# Patient Record
Sex: Male | Born: 1955 | Race: Black or African American | Hispanic: No | Marital: Single | Smoking: Current every day smoker
Health system: Southern US, Community
[De-identification: ages and names within clinical notes are randomized; demographics above are authoritative.]

## PROBLEM LIST (undated history)

## (undated) DIAGNOSIS — M47812 Spondylosis without myelopathy or radiculopathy, cervical region: Secondary | ICD-10-CM

## (undated) DIAGNOSIS — R7302 Impaired glucose tolerance (oral): Secondary | ICD-10-CM

## (undated) DIAGNOSIS — I10 Essential (primary) hypertension: Secondary | ICD-10-CM

## (undated) DIAGNOSIS — E782 Mixed hyperlipidemia: Secondary | ICD-10-CM

## (undated) DIAGNOSIS — E119 Type 2 diabetes mellitus without complications: Secondary | ICD-10-CM

## (undated) DIAGNOSIS — J309 Allergic rhinitis, unspecified: Secondary | ICD-10-CM

## (undated) DIAGNOSIS — F17200 Nicotine dependence, unspecified, uncomplicated: Secondary | ICD-10-CM

## (undated) DIAGNOSIS — B1910 Unspecified viral hepatitis B without hepatic coma: Secondary | ICD-10-CM

## (undated) DIAGNOSIS — T7840XA Allergy, unspecified, initial encounter: Secondary | ICD-10-CM

## (undated) DIAGNOSIS — Z8719 Personal history of other diseases of the digestive system: Secondary | ICD-10-CM

## (undated) HISTORY — DX: Essential (primary) hypertension: I10

## (undated) HISTORY — DX: Nicotine dependence, unspecified, uncomplicated: F17.200

## (undated) HISTORY — DX: Allergic rhinitis, unspecified: J30.9

## (undated) HISTORY — DX: Allergy, unspecified, initial encounter: T78.40XA

## (undated) HISTORY — PX: WISDOM TOOTH EXTRACTION: SHX21

## (undated) HISTORY — DX: Personal history of other diseases of the digestive system: Z87.19

## (undated) HISTORY — DX: Impaired glucose tolerance (oral): R73.02

## (undated) HISTORY — DX: Mixed hyperlipidemia: E78.2

## (undated) HISTORY — DX: Unspecified viral hepatitis B without hepatic coma: B19.10

## (undated) HISTORY — DX: Type 2 diabetes mellitus without complications: E11.9

## (undated) HISTORY — DX: Spondylosis without myelopathy or radiculopathy, cervical region: M47.812

---

## 2004-03-18 ENCOUNTER — Emergency Department (HOSPITAL_COMMUNITY): Admission: EM | Admit: 2004-03-18 | Discharge: 2004-03-18 | Payer: Self-pay | Admitting: Family Medicine

## 2004-08-20 ENCOUNTER — Ambulatory Visit: Payer: Self-pay | Admitting: Internal Medicine

## 2004-08-26 ENCOUNTER — Ambulatory Visit: Payer: Self-pay | Admitting: Internal Medicine

## 2004-08-27 ENCOUNTER — Ambulatory Visit: Payer: Self-pay

## 2004-09-08 ENCOUNTER — Emergency Department (HOSPITAL_COMMUNITY): Admission: EM | Admit: 2004-09-08 | Discharge: 2004-09-08 | Payer: Self-pay | Admitting: Emergency Medicine

## 2004-09-16 ENCOUNTER — Ambulatory Visit: Payer: Self-pay | Admitting: Internal Medicine

## 2004-09-24 ENCOUNTER — Ambulatory Visit: Payer: Self-pay | Admitting: Critical Care Medicine

## 2005-04-07 ENCOUNTER — Ambulatory Visit: Payer: Self-pay | Admitting: Internal Medicine

## 2005-08-04 ENCOUNTER — Ambulatory Visit: Payer: Self-pay | Admitting: Internal Medicine

## 2005-08-16 ENCOUNTER — Ambulatory Visit: Payer: Self-pay

## 2005-08-30 ENCOUNTER — Emergency Department (HOSPITAL_COMMUNITY): Admission: EM | Admit: 2005-08-30 | Discharge: 2005-08-31 | Payer: Self-pay | Admitting: Emergency Medicine

## 2005-08-30 ENCOUNTER — Ambulatory Visit: Payer: Self-pay | Admitting: Internal Medicine

## 2005-08-31 ENCOUNTER — Ambulatory Visit: Payer: Self-pay | Admitting: Internal Medicine

## 2005-08-31 ENCOUNTER — Ambulatory Visit (HOSPITAL_COMMUNITY): Admission: RE | Admit: 2005-08-31 | Discharge: 2005-08-31 | Payer: Self-pay | Admitting: Internal Medicine

## 2005-09-02 ENCOUNTER — Ambulatory Visit (HOSPITAL_COMMUNITY): Admission: RE | Admit: 2005-09-02 | Discharge: 2005-09-02 | Payer: Self-pay | Admitting: Internal Medicine

## 2005-09-05 ENCOUNTER — Ambulatory Visit: Payer: Self-pay | Admitting: Internal Medicine

## 2005-12-07 ENCOUNTER — Ambulatory Visit: Payer: Self-pay | Admitting: Internal Medicine

## 2006-01-09 ENCOUNTER — Ambulatory Visit: Payer: Self-pay | Admitting: Internal Medicine

## 2006-08-15 ENCOUNTER — Ambulatory Visit: Payer: Self-pay | Admitting: Pulmonary Disease

## 2006-10-11 ENCOUNTER — Ambulatory Visit: Payer: Self-pay | Admitting: Internal Medicine

## 2007-05-29 ENCOUNTER — Telehealth: Payer: Self-pay | Admitting: Internal Medicine

## 2007-06-01 DIAGNOSIS — Z9189 Other specified personal risk factors, not elsewhere classified: Secondary | ICD-10-CM | POA: Insufficient documentation

## 2007-06-01 DIAGNOSIS — R079 Chest pain, unspecified: Secondary | ICD-10-CM | POA: Insufficient documentation

## 2007-06-01 DIAGNOSIS — E785 Hyperlipidemia, unspecified: Secondary | ICD-10-CM | POA: Insufficient documentation

## 2007-06-01 DIAGNOSIS — B1910 Unspecified viral hepatitis B without hepatic coma: Secondary | ICD-10-CM

## 2007-06-01 DIAGNOSIS — M47812 Spondylosis without myelopathy or radiculopathy, cervical region: Secondary | ICD-10-CM | POA: Insufficient documentation

## 2007-06-01 DIAGNOSIS — F17200 Nicotine dependence, unspecified, uncomplicated: Secondary | ICD-10-CM

## 2007-06-01 DIAGNOSIS — Z8719 Personal history of other diseases of the digestive system: Secondary | ICD-10-CM

## 2007-06-01 DIAGNOSIS — R51 Headache: Secondary | ICD-10-CM | POA: Insufficient documentation

## 2007-06-01 DIAGNOSIS — R519 Headache, unspecified: Secondary | ICD-10-CM | POA: Insufficient documentation

## 2007-06-01 HISTORY — DX: Spondylosis without myelopathy or radiculopathy, cervical region: M47.812

## 2007-06-01 HISTORY — DX: Nicotine dependence, unspecified, uncomplicated: F17.200

## 2007-06-01 HISTORY — DX: Personal history of other diseases of the digestive system: Z87.19

## 2007-06-01 HISTORY — DX: Unspecified viral hepatitis B without hepatic coma: B19.10

## 2007-06-04 ENCOUNTER — Ambulatory Visit: Payer: Self-pay | Admitting: Internal Medicine

## 2007-06-04 DIAGNOSIS — I10 Essential (primary) hypertension: Secondary | ICD-10-CM

## 2007-06-04 HISTORY — DX: Essential (primary) hypertension: I10

## 2007-07-02 ENCOUNTER — Ambulatory Visit: Payer: Self-pay | Admitting: Internal Medicine

## 2007-07-02 LAB — CONVERTED CEMR LAB
ALT: 23 units/L (ref 0–53)
AST: 20 units/L (ref 0–37)
Albumin: 3.9 g/dL (ref 3.5–5.2)
BUN: 14 mg/dL (ref 6–23)
Basophils Absolute: 0 10*3/uL (ref 0.0–0.1)
Bilirubin, Direct: 0.2 mg/dL (ref 0.0–0.3)
Cholesterol: 249 mg/dL (ref 0–200)
Direct LDL: 152.8 mg/dL
GFR calc Af Amer: 115 mL/min
GFR calc non Af Amer: 95 mL/min
Hemoglobin: 14.5 g/dL (ref 13.0–17.0)
Hgb A1c MFr Bld: 6.2 % — ABNORMAL HIGH (ref 4.6–6.0)
Lymphocytes Relative: 41 % (ref 12.0–46.0)
MCV: 82.4 fL (ref 78.0–100.0)
Neutro Abs: 3.7 10*3/uL (ref 1.4–7.7)
Neutrophils Relative %: 47.9 % (ref 43.0–77.0)
TSH: 1.61 microintl units/mL (ref 0.35–5.50)
Total CHOL/HDL Ratio: 3.6
VLDL: 25 mg/dL (ref 0–40)
WBC: 7.6 10*3/uL (ref 4.5–10.5)

## 2007-07-03 ENCOUNTER — Ambulatory Visit: Payer: Self-pay | Admitting: Internal Medicine

## 2007-07-03 DIAGNOSIS — E782 Mixed hyperlipidemia: Secondary | ICD-10-CM | POA: Insufficient documentation

## 2007-07-03 HISTORY — DX: Mixed hyperlipidemia: E78.2

## 2007-07-09 ENCOUNTER — Telehealth: Payer: Self-pay | Admitting: Internal Medicine

## 2007-08-06 ENCOUNTER — Encounter: Payer: Self-pay | Admitting: Internal Medicine

## 2007-10-02 ENCOUNTER — Encounter: Payer: Self-pay | Admitting: Internal Medicine

## 2007-10-22 ENCOUNTER — Encounter: Payer: Self-pay | Admitting: Internal Medicine

## 2008-01-07 ENCOUNTER — Encounter: Payer: Self-pay | Admitting: Internal Medicine

## 2008-03-24 ENCOUNTER — Ambulatory Visit: Payer: Self-pay | Admitting: Internal Medicine

## 2008-03-24 DIAGNOSIS — E119 Type 2 diabetes mellitus without complications: Secondary | ICD-10-CM

## 2008-03-24 HISTORY — DX: Type 2 diabetes mellitus without complications: E11.9

## 2008-07-14 ENCOUNTER — Telehealth: Payer: Self-pay | Admitting: Internal Medicine

## 2008-07-21 ENCOUNTER — Ambulatory Visit: Payer: Self-pay | Admitting: Internal Medicine

## 2008-07-21 LAB — CONVERTED CEMR LAB
BUN: 13 mg/dL (ref 6–23)
Basophils Absolute: 0 10*3/uL (ref 0.0–0.1)
Basophils Relative: 0.6 % (ref 0.0–3.0)
Bilirubin Urine: NEGATIVE
Bilirubin, Direct: 0.1 mg/dL (ref 0.0–0.3)
CO2: 30 meq/L (ref 19–32)
Cholesterol: 242 mg/dL (ref 0–200)
Direct LDL: 145.3 mg/dL
GFR calc Af Amer: 114 mL/min
GFR calc non Af Amer: 95 mL/min
HCT: 45.1 % (ref 39.0–52.0)
Hemoglobin: 15.1 g/dL (ref 13.0–17.0)
Ketones, ur: NEGATIVE mg/dL
Lymphocytes Relative: 36.3 % (ref 12.0–46.0)
MCHC: 33.4 g/dL (ref 30.0–36.0)
MCV: 83.2 fL (ref 78.0–100.0)
Specific Gravity, Urine: 1.025 (ref 1.000–1.03)
Total Bilirubin: 0.9 mg/dL (ref 0.3–1.2)
Total Protein, Urine: NEGATIVE mg/dL
Total Protein: 7.4 g/dL (ref 6.0–8.3)
Urobilinogen, UA: 0.2 (ref 0.0–1.0)
VLDL: 32 mg/dL (ref 0–40)
pH: 5.5 (ref 5.0–8.0)

## 2008-07-24 ENCOUNTER — Ambulatory Visit: Payer: Self-pay | Admitting: Internal Medicine

## 2008-11-13 ENCOUNTER — Ambulatory Visit: Payer: Self-pay | Admitting: Internal Medicine

## 2008-11-13 DIAGNOSIS — J209 Acute bronchitis, unspecified: Secondary | ICD-10-CM | POA: Insufficient documentation

## 2009-01-21 ENCOUNTER — Telehealth (INDEPENDENT_AMBULATORY_CARE_PROVIDER_SITE_OTHER): Payer: Self-pay | Admitting: *Deleted

## 2009-05-05 ENCOUNTER — Ambulatory Visit: Payer: Self-pay | Admitting: Internal Medicine

## 2009-05-05 DIAGNOSIS — J309 Allergic rhinitis, unspecified: Secondary | ICD-10-CM | POA: Insufficient documentation

## 2009-05-05 DIAGNOSIS — H109 Unspecified conjunctivitis: Secondary | ICD-10-CM | POA: Insufficient documentation

## 2009-05-05 HISTORY — DX: Allergic rhinitis, unspecified: J30.9

## 2009-07-07 ENCOUNTER — Encounter (INDEPENDENT_AMBULATORY_CARE_PROVIDER_SITE_OTHER): Payer: Self-pay | Admitting: *Deleted

## 2010-07-29 NOTE — Letter (Signed)
Summary: Referral - not able to see patient  [facility]  [address] [place], [place] [address]   Phone: [phone]  Fax: [phone]    July 07, 2009   Re:   Peter Schmidt DOB:  1956/06/16 MRN:   [id number]    Dear Dr. Oliver Barre:  Thank you for your kind referral of the above patient.  We have attempted to schedule the recommended procedure for a Colonoscopy but have not been able to schedule because:   X  The patient was not available by phone and/or has not returned our calls.  ___ The patient declined to schedule the procedure at this time.  We appreciate the referral and hope that we will have the opportunity to treat this patient in the future.    Sincerely,    [facility] [phone]

## 2010-09-23 ENCOUNTER — Other Ambulatory Visit: Payer: Self-pay | Admitting: Internal Medicine

## 2010-09-27 ENCOUNTER — Telehealth: Payer: Self-pay

## 2010-09-27 MED ORDER — LISINOPRIL 5 MG PO TABS: 5.0000 mg | ORAL_TABLET | Freq: Every day | ORAL | Status: DC

## 2010-09-27 MED ORDER — SIMVASTATIN 40 MG PO TABS: 40.0000 mg | ORAL_TABLET | Freq: Every day | ORAL | Status: DC

## 2010-09-27 MED ORDER — METFORMIN HCL ER 500 MG PO TB24: 500.0000 mg | ORAL_TABLET | Freq: Every day | ORAL | Status: DC

## 2010-09-27 NOTE — Telephone Encounter (Signed)
Pt called requesting refills of Glucophage, Lisinopril and Simvastatin to [facility]. CPX scheduled 05/03

## 2010-10-24 ENCOUNTER — Encounter: Payer: Self-pay | Admitting: Internal Medicine

## 2010-10-24 DIAGNOSIS — Z0001 Encounter for general adult medical examination with abnormal findings: Secondary | ICD-10-CM | POA: Insufficient documentation

## 2010-10-24 DIAGNOSIS — Z0000 Encounter for general adult medical examination without abnormal findings: Secondary | ICD-10-CM

## 2010-10-25 ENCOUNTER — Other Ambulatory Visit: Payer: Self-pay

## 2010-10-25 ENCOUNTER — Other Ambulatory Visit: Payer: Self-pay | Admitting: Internal Medicine

## 2010-10-25 DIAGNOSIS — Z0389 Encounter for observation for other suspected diseases and conditions ruled out: Secondary | ICD-10-CM

## 2010-10-25 DIAGNOSIS — Z0000 Encounter for general adult medical examination without abnormal findings: Secondary | ICD-10-CM

## 2010-10-28 ENCOUNTER — Ambulatory Visit: Payer: Self-pay | Admitting: Internal Medicine

## 2010-10-28 ENCOUNTER — Encounter: Payer: Self-pay | Admitting: Internal Medicine

## 2010-10-28 ENCOUNTER — Other Ambulatory Visit (INDEPENDENT_AMBULATORY_CARE_PROVIDER_SITE_OTHER): Payer: Self-pay

## 2010-10-28 ENCOUNTER — Ambulatory Visit (INDEPENDENT_AMBULATORY_CARE_PROVIDER_SITE_OTHER): Payer: Self-pay | Admitting: Internal Medicine

## 2010-10-28 VITALS — BP 122/64 | HR 83 | Temp 99.1°F | Ht 68.0 in | Wt 257.8 lb

## 2010-10-28 DIAGNOSIS — E119 Type 2 diabetes mellitus without complications: Secondary | ICD-10-CM

## 2010-10-28 DIAGNOSIS — Z0000 Encounter for general adult medical examination without abnormal findings: Secondary | ICD-10-CM

## 2010-10-28 LAB — HEPATIC FUNCTION PANEL
ALT: 23 U/L (ref 0–53)
AST: 21 U/L (ref 0–37)
Alkaline Phosphatase: 56 U/L (ref 39–117)
Bilirubin, Direct: 0 mg/dL (ref 0.0–0.3)
Total Bilirubin: 0.4 mg/dL (ref 0.3–1.2)

## 2010-10-28 LAB — BASIC METABOLIC PANEL
BUN: 10 mg/dL (ref 6–23)
Calcium: 9.6 mg/dL (ref 8.4–10.5)
GFR: 113.3 mL/min (ref >60.00)
Glucose, Bld: 105 mg/dL — ABNORMAL HIGH (ref 70–99)

## 2010-10-28 LAB — URINALYSIS, ROUTINE W REFLEX MICROSCOPIC
Bilirubin Urine: NEGATIVE
Hgb urine dipstick: NEGATIVE
Nitrite: NEGATIVE
Total Protein, Urine: NEGATIVE

## 2010-10-28 LAB — CBC WITH DIFFERENTIAL/PLATELET
Basophils Absolute: 0 10*3/uL (ref 0.0–0.1)
Basophils Relative: 0.3 % (ref 0.0–3.0)
Eosinophils Absolute: 0.1 10*3/uL (ref 0.0–0.7)
MCHC: 32.6 g/dL (ref 30.0–36.0)
MCV: 82.9 fl (ref 78.0–100.0)
Monocytes Absolute: 0.5 10*3/uL (ref 0.1–1.0)
Neutrophils Relative %: 51 % (ref 43.0–77.0)
Platelets: 221 10*3/uL (ref 150.0–400.0)
RDW: 14.4 % (ref 11.5–14.6)

## 2010-10-28 LAB — TSH: TSH: 0.86 u[IU]/mL (ref 0.35–5.50)

## 2010-10-28 LAB — LIPID PANEL: Cholesterol: 186 mg/dL (ref 0–200)

## 2010-10-28 MED ORDER — HYDROCODONE-ACETAMINOPHEN 5-500 MG PO TABS: 1.0000 | ORAL_TABLET | Freq: Four times a day (QID) | ORAL | Status: DC | PRN

## 2010-10-28 MED ORDER — LISINOPRIL 5 MG PO TABS: 5.0000 mg | ORAL_TABLET | Freq: Every day | ORAL | Status: DC

## 2010-10-28 MED ORDER — VARDENAFIL HCL 20 MG PO TABS: 20.0000 mg | ORAL_TABLET | Freq: Every day | ORAL | Status: DC | PRN

## 2010-10-28 MED ORDER — METFORMIN HCL ER 500 MG PO TB24: 500.0000 mg | ORAL_TABLET | Freq: Every day | ORAL | Status: DC

## 2010-10-28 MED ORDER — SIMVASTATIN 40 MG PO TABS: 40.0000 mg | ORAL_TABLET | Freq: Every day | ORAL | Status: DC

## 2010-10-28 NOTE — Assessment & Plan Note (Signed)
With large recent wt gain, control uncertain, Continue all other medications as before, but will need a1c   Lab Results  Component Value Date   HGBA1C 6.2* 07/02/2007

## 2010-10-28 NOTE — Patient Instructions (Signed)
Continue all other medications as before Please go to LAB in the Basement for the blood and/or urine tests to be done today Please call the number on the Blue Card (the [employer] System) for results of testing in 2-3 days Please return in 1 year for your yearly visit, or sooner if needed, with Lab testing done 3-5 days before  

## 2010-10-28 NOTE — Progress Notes (Signed)
Subjective:    Patient ID: Peter Schmidt, male    DOB: 1955/07/11, 55 y.o.   MRN: [id number]  HPI Here for wellness and f/u;  Overall doing ok;  Pt denies CP, worsening SOB, DOE, wheezing, orthopnea, PND, worsening LE edema, palpitations, dizziness or syncope.  Pt denies neurological change such as new Headache, facial or extremity weakness.  Pt denies polydipsia, polyuria, or low sugar symptoms. Pt states overall good compliance with treatment and medications, good tolerability, but unfortunately has not been trying to follow lower cholesterol diet.  Pt denies worsening depressive symptoms, suicidal ideation or panic. No fever, wt loss, night sweats, loss of appetite, or other constitutional symptoms.  Pt states good ability with ADL's, low fall risk, home safety reviewed and adequate, no significant changes in hearing or vision, and occasionally active with exercise. Wt is actually up 27 lbs since nov 2010 when last seen. Denies daytime hypersomnia Past Medical History  Diagnosis Date  . HEPATITIS B 06/01/2007  . DIABETES MELLITUS, TYPE II 03/24/2008  . HYPERLIPIDEMIA 07/03/2007  . TOBACCO ABUSE 06/01/2007  . HYPERTENSION 06/04/2007  . ALLERGIC RHINITIS 05/05/2009  . SPONDYLOSIS, CERVICAL 06/01/2007  . RECTAL BLEEDING, HX OF 06/01/2007  . DRUG ABUSE, HX OF 06/01/2007   No past surgical history on file.  reports that he has been smoking.  He does not have any smokeless tobacco history on file. He reports that he does not drink alcohol or use illicit drugs. family history includes Alcohol abuse in his father; Cancer in his father; Diabetes in his mother; Heart disease in his mother; and Stroke in his father and mother. No Known Allergies Current Outpatient Prescriptions on File Prior to Visit  Medication Sig Dispense Refill  . DISCONTD: HYDROcodone-acetaminophen (VICODIN) 5-500 MG per tablet One tablet by mouth every 12 hours as needed for neck pain       . DISCONTD: lisinopril (PRINIVIL,ZESTRIL) 5 MG  tablet Take 1 tablet (5 mg total) by mouth daily.  30 tablet  1  . DISCONTD: metFORMIN (GLUCOPHAGE-XR) 500 MG 24 hr tablet Take 1 tablet (500 mg total) by mouth daily with breakfast.  30 tablet  1  . DISCONTD: simvastatin (ZOCOR) 40 MG tablet Take 1 tablet (40 mg total) by mouth at bedtime.  30 tablet  1  . aspirin 81 MG EC tablet Take 81 mg by mouth daily.        Marland Kitchen DISCONTD: cetirizine (ZYRTEC) 10 MG tablet Take 10 mg by mouth daily.        Marland Kitchen DISCONTD: erythromycin ophthalmic ointment Use as directed 4 times a day for 10 days       . DISCONTD: vardenafil (LEVITRA) 20 MG tablet 1 by mouth every other day as needed        Review of Systems Review of Systems  Constitutional: Negative for diaphoresis, activity change, appetite change and unexpected weight change.  HENT: Negative for hearing loss, ear pain, facial swelling, mouth sores and neck stiffness.   Eyes: Negative for pain, redness and visual disturbance.  Respiratory: Negative for shortness of breath and wheezing.   Cardiovascular: Negative for chest pain and palpitations.  Gastrointestinal: Negative for diarrhea, blood in stool, abdominal distention and rectal pain.  Genitourinary: Negative for hematuria, flank pain and decreased urine volume.  Musculoskeletal: Negative for myalgias and joint swelling.  Skin: Negative for color change and wound.  Neurological: Negative for syncope and numbness.  Hematological: Negative for adenopathy.  Psychiatric/Behavioral: Negative for hallucinations, self-injury, decreased concentration and agitation.  Objective:   Physical Exam BP 122/64  Pulse 83  Temp(Src) 99.1 F (37.3 C) (Oral)  Ht 5\' 8"  (1.727 m)  Wt 257 lb 12 oz (116.915 kg)  BMI 39.19 kg/m2  SpO2 95% Physical Exam  VS noted/morbid obese Constitutional: Pt is oriented to person, place, and time. Appears well-developed and well-nourished.  HENT:  Head: Normocephalic and atraumatic.  Right Ear: External ear normal.  Left Ear:  External ear normal.  Nose: Nose normal.  Mouth/Throat: Oropharynx is clear and moist.  Eyes: Conjunctivae and EOM are normal. Pupils are equal, round, and reactive to light.  Neck: Normal range of motion. Neck supple. No JVD present. No tracheal deviation present.  Cardiovascular: Normal rate, regular rhythm, normal heart sounds and intact distal pulses.   Pulmonary/Chest: Effort normal and breath sounds normal.  Abdominal: Soft. Bowel sounds are normal. There is no tenderness.  Musculoskeletal: Normal range of motion. Exhibits no edema.  Lymphadenopathy:  Has no cervical adenopathy.  Neurological: Pt is alert and oriented to person, place, and time. Pt has normal reflexes. No cranial nerve deficit.  Skin: Skin is warm and dry. No rash noted.  Psychiatric:  Has  normal mood and affect. Behavior is normal. 1+ nervous        Assessment & Plan:

## 2010-10-28 NOTE — Assessment & Plan Note (Signed)

## 2010-11-12 NOTE — Assessment & Plan Note (Signed)
[facility]                             PULMONARY OFFICE NOTE   NAME:Peter Schmidt, Peter Schmidt                          MRN:          [id number]  DATE:08/15/2006                            DOB:          [birth year]    HISTORY OF PRESENT ILLNESS:  The patient is a 55 year old African  American male patient of Dr. Olegario Messier who has a known history of chronic  neck pain, alcoholism and hyperlipidemia presents for an acute office  visit.  The patient complains of a 1 day history of fever, T-max of 103,  body aches and cold symptoms with cough, nasal congestion.  The patient  is not using over the counter products for treatment.  Denies any chest  pain, orthopnea, Hemostasis, recent travel, antibiotic use, nausea,  vomiting.   PAST MEDICAL HISTORY:  Reviewed.   MEDICATIONS:  Reviewed.   PHYSICAL EXAMINATION:  The patient is a pleasant obese male in no acute  distress.  His temperature is 103.1, blood pressure 118/78, O2 saturations 96% on  room air, weight is 2049.  HEENT:  His nasal mucosa is red and swollen.  No tender sinuses. TMs  normal.  Posterior pharynx is clear.  NECK:  Supple without adenopathy.  LUNGS:  Clear to auscultation bilaterally without any wheezing or  crackles.  CARDIAC:  Regular rate and rhythm without murmur, rub or gallop.  ABDOMEN:  Obese, soft and nontender.  No hepatosplenomegaly.  EXTREMITIES:  Warm without any calf tenderness, cyanosis, clubbing or  edema.  SKIN:  Warm without rash.   IMPRESSION/PLAN:  Suspected acute influenza.  The patient is to begin  Tamiflu 75 mg b.i.d. times 5 days.  Mucinex DM twice a day for cough.  Tylenol for fever.  Increase fluid intake.  I had advised the patient  his symptoms should run its course over the next 5-7 days.  If symptoms  worsen or do not improve he is to contact our office.      Rubye Oaks, NP  Electronically Signed      Lonzo Cloud. Kriste Basque, MD  Electronically Signed   TP/MedQ  DD:  08/15/2006  DT: 08/15/2006  Job #: [id number]

## 2011-01-07 ENCOUNTER — Encounter: Payer: Self-pay | Admitting: Internal Medicine

## 2011-01-07 DIAGNOSIS — Z0289 Encounter for other administrative examinations: Secondary | ICD-10-CM

## 2011-11-02 ENCOUNTER — Other Ambulatory Visit: Payer: Self-pay

## 2011-11-02 MED ORDER — METFORMIN HCL ER 500 MG PO TB24: 500.0000 mg | ORAL_TABLET | Freq: Every day | ORAL | Status: DC

## 2011-11-02 MED ORDER — SIMVASTATIN 40 MG PO TABS: 40.0000 mg | ORAL_TABLET | Freq: Every day | ORAL | Status: DC

## 2011-11-17 ENCOUNTER — Other Ambulatory Visit: Payer: Self-pay | Admitting: Internal Medicine

## 2012-01-27 ENCOUNTER — Other Ambulatory Visit: Payer: Self-pay | Admitting: Internal Medicine

## 2012-02-01 ENCOUNTER — Other Ambulatory Visit: Payer: Self-pay

## 2012-02-01 MED ORDER — HYDROCODONE-ACETAMINOPHEN 5-500 MG PO TABS: 1.0000 | ORAL_TABLET | Freq: Four times a day (QID) | ORAL | Status: DC | PRN

## 2012-02-01 MED ORDER — LISINOPRIL 5 MG PO TABS: 5.0000 mg | ORAL_TABLET | Freq: Every day | ORAL | Status: DC

## 2012-02-01 NOTE — Telephone Encounter (Signed)
Patient called to schedule physcial appt. For September and needs refill on BP and pain medication.

## 2012-02-01 NOTE — Telephone Encounter (Signed)
vicodin Done hardcopy to [facility], bp med refilled

## 2012-02-02 NOTE — Telephone Encounter (Signed)
Faxed hardcopy to pharmacy. 

## 2012-02-08 ENCOUNTER — Other Ambulatory Visit: Payer: Self-pay | Admitting: Internal Medicine

## 2012-03-26 ENCOUNTER — Ambulatory Visit (INDEPENDENT_AMBULATORY_CARE_PROVIDER_SITE_OTHER): Payer: PRIVATE HEALTH INSURANCE | Admitting: Internal Medicine

## 2012-03-26 ENCOUNTER — Other Ambulatory Visit (INDEPENDENT_AMBULATORY_CARE_PROVIDER_SITE_OTHER): Payer: PRIVATE HEALTH INSURANCE

## 2012-03-26 ENCOUNTER — Encounter: Payer: Self-pay | Admitting: Internal Medicine

## 2012-03-26 VITALS — BP 110/62 | HR 78 | Temp 98.1°F | Ht 68.0 in | Wt 248.5 lb

## 2012-03-26 DIAGNOSIS — Z0000 Encounter for general adult medical examination without abnormal findings: Secondary | ICD-10-CM

## 2012-03-26 DIAGNOSIS — E119 Type 2 diabetes mellitus without complications: Secondary | ICD-10-CM

## 2012-03-26 DIAGNOSIS — Z23 Encounter for immunization: Secondary | ICD-10-CM

## 2012-03-26 LAB — BASIC METABOLIC PANEL
BUN: 12 mg/dL (ref 6–23)
Calcium: 9.3 mg/dL (ref 8.4–10.5)
Creatinine, Ser: 0.9 mg/dL (ref 0.4–1.5)
GFR: 114.17 mL/min (ref >60.00)

## 2012-03-26 LAB — HEMOGLOBIN A1C: Hgb A1c MFr Bld: 6.2 % (ref 4.6–6.5)

## 2012-03-26 LAB — CBC WITH DIFFERENTIAL/PLATELET
Basophils Relative: 0.6 % (ref 0.0–3.0)
Eosinophils Relative: 1.1 % (ref 0.0–5.0)
HCT: 41.8 % (ref 39.0–52.0)
Hemoglobin: 13.6 g/dL (ref 13.0–17.0)
Lymphs Abs: 3 10*3/uL (ref 0.7–4.0)
MCV: 80.9 fl (ref 78.0–100.0)
Monocytes Absolute: 0.6 10*3/uL (ref 0.1–1.0)
Neutro Abs: 4.4 10*3/uL (ref 1.4–7.7)
Neutrophils Relative %: 53.4 % (ref 43.0–77.0)
RBC: 5.17 Mil/uL (ref 4.22–5.81)
WBC: 8.1 10*3/uL (ref 4.5–10.5)

## 2012-03-26 LAB — LIPID PANEL
HDL: 62.5 mg/dL (ref >39.00)
Total CHOL/HDL Ratio: 3
Triglycerides: 148 mg/dL (ref 0.0–149.0)

## 2012-03-26 LAB — HEPATIC FUNCTION PANEL: Total Bilirubin: 0.4 mg/dL (ref 0.3–1.2)

## 2012-03-26 MED ORDER — SILDENAFIL CITRATE 100 MG PO TABS
100.0000 mg | ORAL_TABLET | Freq: Every day | ORAL | Status: DC | PRN
Start: 2012-03-26 — End: 2017-09-28

## 2012-03-26 MED ORDER — SIMVASTATIN 40 MG PO TABS: 40.0000 mg | ORAL_TABLET | Freq: Every day | ORAL | Status: DC

## 2012-03-26 MED ORDER — METFORMIN HCL ER 500 MG PO TB24: 500.0000 mg | ORAL_TABLET | Freq: Every day | ORAL | Status: DC

## 2012-03-26 MED ORDER — LISINOPRIL 5 MG PO TABS: 5.0000 mg | ORAL_TABLET | Freq: Every day | ORAL | Status: DC

## 2012-03-26 NOTE — Patient Instructions (Addendum)
Please consider the echocardiogram for the abnormal EKG with Left atrial abnormality Please go to LAB in the Basement for the blood and/or urine tests to be done today You will be contacted by phone if any changes need to be made immediately.  Otherwise, you will receive a letter about your results with an explanation. Please remember to sign up for My Chart at your earliest convenience, as this will be important to you in the future with finding out test results. You had the flu shot today You are otherwise up to date with prevention Sorry we did not have the samples you requested, but you have the viagra coupon to use You will be contacted regarding the referral for: colonoscopy for after Jun 27 2012 Please return in 6 mo with Lab testing done 3-5 days before

## 2012-03-26 NOTE — Progress Notes (Signed)
Subjective:    Patient ID: Peter Schmidt, male    DOB: October 02, 1955, 56 y.o.   MRN: [id number]  HPI  Here for wellness and f/u;  Overall doing ok;  Pt denies CP, worsening SOB, DOE, wheezing, orthopnea, PND, worsening LE edema, palpitations, dizziness or syncope.  Pt denies neurological change such as new Headache, facial or extremity weakness.  Pt denies polydipsia, polyuria, or low sugar symptoms. Pt states overall good compliance with treatment and medications, good tolerability, and trying to follow lower cholesterol diet.  Pt denies worsening depressive symptoms, suicidal ideation or panic. No fever, wt loss, night sweats, loss of appetite, or other constitutional symptoms.  Pt states good ability with ADL's, low fall risk, home safety reviewed and adequate, no significant changes in hearing or vision, and occasionally active with exercise.  No acute complaints Past Medical History  Diagnosis Date  . HEPATITIS B 06/01/2007  . DIABETES MELLITUS, TYPE II 03/24/2008  . HYPERLIPIDEMIA 07/03/2007  . TOBACCO ABUSE 06/01/2007  . HYPERTENSION 06/04/2007  . ALLERGIC RHINITIS 05/05/2009  . SPONDYLOSIS, CERVICAL 06/01/2007  . RECTAL BLEEDING, HX OF 06/01/2007  . DRUG ABUSE, HX OF 06/01/2007   No past surgical history on file.  reports that he has been smoking.  He does not have any smokeless tobacco history on file. He reports that he does not drink alcohol or use illicit drugs. family history includes Alcohol abuse in his father; Cancer in his father; Diabetes in his mother; Heart disease in his mother; and Stroke in his father and mother. No Known Allergies Current Outpatient Prescriptions on File Prior to Visit  Medication Sig Dispense Refill  . aspirin 81 MG EC tablet Take 81 mg by mouth daily.        Marland Kitchen HYDROcodone-acetaminophen (VICODIN) 5-500 MG per tablet Take 1 tablet by mouth every 6 (six) hours as needed for pain. One tablet by mouth every 12 hours as needed for neck pain  60 tablet  2  . vardenafil  (LEVITRA) 20 MG tablet Take 1 tablet (20 mg total) by mouth daily as needed for erectile dysfunction. 1 by mouth every other day as needed  10 tablet  11  . DISCONTD: lisinopril (PRINIVIL,ZESTRIL) 5 MG tablet Take 1 tablet (5 mg total) by mouth daily.  90 tablet  0  . DISCONTD: metFORMIN (GLUCOPHAGE-XR) 500 MG 24 hr tablet take 1 tablet by mouth once daily WITH BREAKFAST  90 tablet  0  . DISCONTD: simvastatin (ZOCOR) 40 MG tablet take 1 tablet by mouth at bedtime  90 tablet  0  . sildenafil (VIAGRA) 100 MG tablet Take 1 tablet (100 mg total) by mouth daily as needed for erectile dysfunction.  3 tablet  0   Review of Systems Review of Systems  Constitutional: Negative for diaphoresis, activity change, appetite change and unexpected weight change.  HENT: Negative for hearing loss, ear pain, facial swelling, mouth sores and neck stiffness.   Eyes: Negative for pain, redness and visual disturbance.  Respiratory: Negative for shortness of breath and wheezing.   Cardiovascular: Negative for chest pain and palpitations.  Gastrointestinal: Negative for diarrhea, blood in stool, abdominal distention and rectal pain.  Genitourinary: Negative for hematuria, flank pain and decreased urine volume.  Musculoskeletal: Negative for myalgias and joint swelling.  Skin: Negative for color change and wound.  Neurological: Negative for syncope and numbness.  Hematological: Negative for adenopathy.  Psychiatric/Behavioral: Negative for hallucinations, self-injury, decreased concentration and agitation.      Objective:   Physical Exam  BP 110/62  Pulse 78  Temp 98.1 F (36.7 C) (Oral)  Ht 5\' 8"  (1.727 m)  Wt 248 lb 8 oz (112.719 kg)  BMI 37.78 kg/m2  SpO2 97% Physical Exam  VS noted Constitutional: Pt is oriented to person, place, and time. Appears well-developed and well-nourished. Lavella Lemons HENT:  Head: Normocephalic and atraumatic.  Right Ear: External ear normal.  Left Ear: External ear normal.  Nose:  Nose normal.  Mouth/Throat: Oropharynx is clear and moist.  Eyes: Conjunctivae and EOM are normal. Pupils are equal, round, and reactive to light.  Neck: Normal range of motion. Neck supple. No JVD present. No tracheal deviation present.  Cardiovascular: Normal rate, regular rhythm, normal heart sounds and intact distal pulses.   Pulmonary/Chest: Effort normal and breath sounds normal.  Abdominal: Soft. Bowel sounds are normal. There is no tenderness.  Musculoskeletal: Normal range of motion. Exhibits no edema.  Lymphadenopathy:  Has no cervical adenopathy.  Neurological: Pt is alert and oriented to person, place, and time. Pt has normal reflexes. No cranial nerve deficit.  Skin: Skin is warm and dry. No rash noted.  Psychiatric:  Has  normal mood and affect. Behavior is normal.     Assessment & Plan:

## 2012-03-26 NOTE — Assessment & Plan Note (Addendum)
Overall doing well, age appropriate education and counseling updated, referrals for preventative services and immunizations addressed, dietary and smoking counseling addressed, most recent labs and ECG reviewed.  I have personally reviewed and have noted: 1) the patient's medical and social history 2) The pt's use of alcohol, tobacco, and illicit drugs 3) The patient's current medications and supplements 4) Functional ability including ADL's, fall risk, home safety risk, hearing and visual impairment 5) Diet and physical activities 6) Evidence for depression or mood disorder 7) The patient's height, weight, and BMI have been recorded in the chart I have made referrals, and provided counseling and education based on review of the above ECG reviewed as per emr - to consider echo; also for colonscopy as he is due

## 2012-03-27 LAB — PSA: PSA: 0.25 ng/mL (ref 0.10–4.00)

## 2012-03-28 ENCOUNTER — Encounter: Payer: Self-pay | Admitting: Internal Medicine

## 2012-08-19 ENCOUNTER — Other Ambulatory Visit: Payer: Self-pay | Admitting: Internal Medicine

## 2012-08-20 ENCOUNTER — Telehealth: Payer: Self-pay | Admitting: Internal Medicine

## 2012-08-20 MED ORDER — HYDROCODONE-ACETAMINOPHEN 5-325 MG PO TABS: 1.0000 | ORAL_TABLET | Freq: Four times a day (QID) | ORAL | Status: DC | PRN

## 2012-08-20 NOTE — Telephone Encounter (Signed)
They no longer make Vicodin5/500, request call back

## 2012-08-20 NOTE — Telephone Encounter (Signed)
Done hardcopy to [facility]  

## 2012-08-20 NOTE — Telephone Encounter (Signed)
Faxed hardcopy to pharmacy. 

## 2012-08-21 NOTE — Telephone Encounter (Signed)
Faxed hardcopy to pharmacy and called the patient to inform of change and refill.

## 2012-09-24 ENCOUNTER — Ambulatory Visit: Payer: PRIVATE HEALTH INSURANCE | Admitting: Internal Medicine

## 2012-09-24 DIAGNOSIS — Z0289 Encounter for other administrative examinations: Secondary | ICD-10-CM

## 2013-03-27 ENCOUNTER — Other Ambulatory Visit: Payer: Self-pay | Admitting: Internal Medicine

## 2013-05-05 ENCOUNTER — Emergency Department (HOSPITAL_COMMUNITY)
Admission: EM | Admit: 2013-05-05 | Discharge: 2013-05-05 | Disposition: A | Discharge status: Home / Self Care | Payer: PRIVATE HEALTH INSURANCE | Attending: Emergency Medicine | Admitting: Emergency Medicine

## 2013-05-05 ENCOUNTER — Emergency Department (HOSPITAL_COMMUNITY): Payer: PRIVATE HEALTH INSURANCE

## 2013-05-05 ENCOUNTER — Encounter (HOSPITAL_COMMUNITY): Payer: Self-pay | Admitting: Emergency Medicine

## 2013-05-05 DIAGNOSIS — E782 Mixed hyperlipidemia: Secondary | ICD-10-CM | POA: Insufficient documentation

## 2013-05-05 DIAGNOSIS — Z7982 Long term (current) use of aspirin: Secondary | ICD-10-CM | POA: Insufficient documentation

## 2013-05-05 DIAGNOSIS — S0003XA Contusion of scalp, initial encounter: Secondary | ICD-10-CM | POA: Insufficient documentation

## 2013-05-05 DIAGNOSIS — S161XXA Strain of muscle, fascia and tendon at neck level, initial encounter: Secondary | ICD-10-CM

## 2013-05-05 DIAGNOSIS — E119 Type 2 diabetes mellitus without complications: Secondary | ICD-10-CM | POA: Insufficient documentation

## 2013-05-05 DIAGNOSIS — F17200 Nicotine dependence, unspecified, uncomplicated: Secondary | ICD-10-CM | POA: Insufficient documentation

## 2013-05-05 DIAGNOSIS — R209 Unspecified disturbances of skin sensation: Secondary | ICD-10-CM | POA: Insufficient documentation

## 2013-05-05 DIAGNOSIS — M549 Dorsalgia, unspecified: Secondary | ICD-10-CM | POA: Insufficient documentation

## 2013-05-05 DIAGNOSIS — Y92410 Unspecified street and highway as the place of occurrence of the external cause: Secondary | ICD-10-CM | POA: Insufficient documentation

## 2013-05-05 DIAGNOSIS — Y9389 Activity, other specified: Secondary | ICD-10-CM | POA: Insufficient documentation

## 2013-05-05 DIAGNOSIS — M25569 Pain in unspecified knee: Secondary | ICD-10-CM | POA: Insufficient documentation

## 2013-05-05 DIAGNOSIS — M47812 Spondylosis without myelopathy or radiculopathy, cervical region: Secondary | ICD-10-CM | POA: Insufficient documentation

## 2013-05-05 DIAGNOSIS — B1910 Unspecified viral hepatitis B without hepatic coma: Secondary | ICD-10-CM | POA: Insufficient documentation

## 2013-05-05 DIAGNOSIS — Z79899 Other long term (current) drug therapy: Secondary | ICD-10-CM | POA: Insufficient documentation

## 2013-05-05 DIAGNOSIS — I10 Essential (primary) hypertension: Secondary | ICD-10-CM | POA: Insufficient documentation

## 2013-05-05 DIAGNOSIS — S139XXA Sprain of joints and ligaments of unspecified parts of neck, initial encounter: Secondary | ICD-10-CM | POA: Insufficient documentation

## 2013-05-05 DIAGNOSIS — M25579 Pain in unspecified ankle and joints of unspecified foot: Secondary | ICD-10-CM | POA: Insufficient documentation

## 2013-05-05 MED ORDER — HYDROCODONE-ACETAMINOPHEN 5-325 MG PO TABS
1.0000 | ORAL_TABLET | Freq: Once | ORAL | Status: AC
  Administered 2013-05-05: 1 via ORAL
  Filled 2013-05-05: qty 1

## 2013-05-05 MED ORDER — CYCLOBENZAPRINE HCL 10 MG PO TABS: 10.0000 mg | ORAL_TABLET | Freq: Three times a day (TID) | ORAL | Status: DC | PRN

## 2013-05-05 MED ORDER — HYDROCODONE-ACETAMINOPHEN 5-325 MG PO TABS: 1.0000 | ORAL_TABLET | ORAL | Status: DC | PRN

## 2013-05-05 NOTE — ED Provider Notes (Signed)
Medical screening examination/treatment/procedure(s) were performed by non-physician practitioner and as supervising physician I was immediately available for consultation/collaboration.  EKG Interpretation   None         Rolan Bucco, MD 05/05/13 2236
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
## 2013-05-05 NOTE — ED Notes (Signed)
Error in documentation   xray has not been performed

## 2013-05-09 ENCOUNTER — Ambulatory Visit (INDEPENDENT_AMBULATORY_CARE_PROVIDER_SITE_OTHER): Payer: PRIVATE HEALTH INSURANCE | Admitting: Internal Medicine

## 2013-05-09 ENCOUNTER — Encounter: Payer: Self-pay | Admitting: Internal Medicine

## 2013-05-09 VITALS — BP 120/70 | HR 85 | Temp 97.1°F | Ht 68.0 in | Wt 257.5 lb

## 2013-05-09 DIAGNOSIS — IMO0001 Reserved for inherently not codable concepts without codable children: Secondary | ICD-10-CM

## 2013-05-09 DIAGNOSIS — M5412 Radiculopathy, cervical region: Secondary | ICD-10-CM

## 2013-05-09 DIAGNOSIS — E119 Type 2 diabetes mellitus without complications: Secondary | ICD-10-CM

## 2013-05-09 DIAGNOSIS — I10 Essential (primary) hypertension: Secondary | ICD-10-CM

## 2013-05-09 DIAGNOSIS — M542 Cervicalgia: Secondary | ICD-10-CM

## 2013-05-09 DIAGNOSIS — Z23 Encounter for immunization: Secondary | ICD-10-CM

## 2013-05-09 DIAGNOSIS — Z0000 Encounter for general adult medical examination without abnormal findings: Secondary | ICD-10-CM

## 2013-05-09 DIAGNOSIS — F411 Generalized anxiety disorder: Secondary | ICD-10-CM

## 2013-05-09 MED ORDER — TRAMADOL HCL 50 MG PO TABS: 50.0000 mg | ORAL_TABLET | Freq: Four times a day (QID) | ORAL | Status: DC | PRN

## 2013-05-09 MED ORDER — PREDNISONE 10 MG PO TABS: 10.0000 mg | ORAL_TABLET | Freq: Every day | ORAL | Status: DC

## 2013-05-09 MED ORDER — ALPRAZOLAM 0.25 MG PO TABS: 0.2500 mg | ORAL_TABLET | Freq: Two times a day (BID) | ORAL | Status: DC | PRN

## 2013-05-09 NOTE — Patient Instructions (Addendum)
Please take all new medication as prescribed  - the prednisone, ultram, and alprazolam Please continue all other medications as before You will be contacted regarding the referral for: MRI for neck, and Dr Channing Mutters Please keep your appointments with your specialists as you have planned - therapist  Please continue all other medications as before, and refills have been done if requested. Please have the pharmacy call with any other refills you may need.  Please remember to sign up for My Chart if you have not done so, as this will be important to you in the future with finding out test results, communicating by private email, and scheduling acute appointments online when needed.  Please return in 3 months, or sooner if needed, with Lab testing done 3-5 days before

## 2013-05-09 NOTE — Progress Notes (Signed)
Pre-visit discussion using our clinic review tool. No additional management support is needed unless otherwise documented below in the visit note.  

## 2013-05-10 ENCOUNTER — Telehealth: Payer: Self-pay | Admitting: Internal Medicine

## 2013-05-10 NOTE — Telephone Encounter (Signed)
Letter completed and called the patient left a detailed message letter is ready for pickup at the front desk.

## 2013-05-10 NOTE — Telephone Encounter (Signed)
Pt request out of work note from 05/06/13-05/10/2013. Pt was in the ER Sunday 05/05/13. Please advise. Pt forgot to ask for last night at the ov.

## 2013-05-10 NOTE — Telephone Encounter (Signed)
Ok for note - to robin to help 

## 2013-05-13 DIAGNOSIS — M542 Cervicalgia: Secondary | ICD-10-CM | POA: Insufficient documentation

## 2013-05-13 DIAGNOSIS — M5412 Radiculopathy, cervical region: Secondary | ICD-10-CM | POA: Insufficient documentation

## 2013-05-13 DIAGNOSIS — F411 Generalized anxiety disorder: Secondary | ICD-10-CM | POA: Insufficient documentation

## 2013-05-13 DIAGNOSIS — G8929 Other chronic pain: Secondary | ICD-10-CM | POA: Insufficient documentation

## 2013-05-13 NOTE — Assessment & Plan Note (Signed)
stable overall by history and exam, recent data reviewed with pt, and pt to continue medical treatment as before,  to f/u any worsening symptoms or concerns Lab Results  Component Value Date   HGBA1C 6.2 03/26/2012   To call for onset polys with prednisone tx or cbg > 200
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
## 2013-05-13 NOTE — Assessment & Plan Note (Signed)
stable overall by history and exam, recent data reviewed with pt, and pt to continue medical treatment as before,  to f/u any worsening symptoms or concerns BP Readings from Last 3 Encounters:  05/09/13 120/70  05/05/13 147/83  03/26/12 110/62
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
## 2013-05-13 NOTE — Assessment & Plan Note (Addendum)
Right neck and arm pain, with neuro change, for predpack, for MRI and referral to Dr Roy/NS  Note:  Total time for pt hx, exam, review of record with pt in the room, determination of diagnoses and plan for further eval and tx is > 40 min, with over 50% spent in coordination and counseling of patient

## 2013-05-13 NOTE — Assessment & Plan Note (Signed)
Pt plans to f/u with a therapist, for xanax prn

## 2013-05-17 ENCOUNTER — Telehealth: Payer: Self-pay | Admitting: Internal Medicine

## 2013-05-17 ENCOUNTER — Encounter: Payer: Self-pay | Admitting: Internal Medicine

## 2013-05-17 ENCOUNTER — Ambulatory Visit
Admission: RE | Admit: 2013-05-17 | Discharge: 2013-05-17 | Disposition: A | Discharge status: Home / Self Care | Payer: PRIVATE HEALTH INSURANCE | Source: Ambulatory Visit | Attending: Internal Medicine | Admitting: Internal Medicine

## 2013-05-17 DIAGNOSIS — M5412 Radiculopathy, cervical region: Secondary | ICD-10-CM

## 2013-05-17 NOTE — Telephone Encounter (Signed)
Very sorry, but a prescription for tramadol was just given at last visit, and we should tx with both

## 2013-05-17 NOTE — Telephone Encounter (Signed)
Pt request written Rx for hydrocodone. Pt stated that drug store told him to req from PCP. Please call pt

## 2013-05-17 NOTE — Telephone Encounter (Signed)
Patient informed of MD instructions. 

## 2013-05-17 NOTE — Telephone Encounter (Signed)
Still, since this is a controlled substance, I cannot change at this time

## 2013-05-17 NOTE — Telephone Encounter (Signed)
The patient stated he never filled the tramadol

## 2013-06-24 ENCOUNTER — Other Ambulatory Visit: Payer: Self-pay | Admitting: Internal Medicine

## 2013-09-10 ENCOUNTER — Ambulatory Visit (INDEPENDENT_AMBULATORY_CARE_PROVIDER_SITE_OTHER): Payer: PRIVATE HEALTH INSURANCE | Admitting: Internal Medicine

## 2013-09-10 VITALS — BP 122/70 | HR 76 | Temp 98.5°F | Ht 68.0 in | Wt 251.0 lb

## 2013-09-10 DIAGNOSIS — E119 Type 2 diabetes mellitus without complications: Secondary | ICD-10-CM

## 2013-09-10 DIAGNOSIS — J209 Acute bronchitis, unspecified: Secondary | ICD-10-CM

## 2013-09-10 DIAGNOSIS — I10 Essential (primary) hypertension: Secondary | ICD-10-CM

## 2013-09-10 MED ORDER — AZITHROMYCIN 250 MG PO TABS: ORAL_TABLET | ORAL | Status: DC

## 2013-09-10 MED ORDER — HYDROCODONE-HOMATROPINE 5-1.5 MG/5ML PO SYRP: 5.0000 mL | ORAL_SOLUTION | Freq: Four times a day (QID) | ORAL | Status: DC | PRN

## 2013-09-10 MED ORDER — OSELTAMIVIR PHOSPHATE 75 MG PO CAPS: 75.0000 mg | ORAL_CAPSULE | Freq: Two times a day (BID) | ORAL | Status: DC

## 2013-09-10 NOTE — Progress Notes (Signed)
Pre visit review using our clinic review tool, if applicable. No additional management support is needed unless otherwise documented below in the visit note. 

## 2013-09-10 NOTE — Progress Notes (Signed)
Subjective:    Patient ID: Peter Schmidt, male    DOB: 03-13-1956, 58 y.o.   MRN: [id number]  HPI  Here with acute onset mild to mod 2-3 days ST, HA, general weakness and malaise, with prod cough greenish sputum, but Pt denies chest pain, increased sob or doe, wheezing, orthopnea, PND, increased LE swelling, palpitations, dizziness or syncope, also with nausea and several watery loose stools as well.   Pt denies polydipsia, polyuria.  Past Medical History  Diagnosis Date  . HEPATITIS B 06/01/2007  . DIABETES MELLITUS, TYPE II 03/24/2008  . HYPERLIPIDEMIA 07/03/2007  . TOBACCO ABUSE 06/01/2007  . HYPERTENSION 06/04/2007  . ALLERGIC RHINITIS 05/05/2009  . SPONDYLOSIS, CERVICAL 06/01/2007  . RECTAL BLEEDING, HX OF 06/01/2007   No past surgical history on file.  reports that he has been smoking.  He does not have any smokeless tobacco history on file. He reports that he does not drink alcohol or use illicit drugs. family history includes Alcohol abuse in his father; Cancer in his father; Diabetes in his mother; Heart disease in his mother; Stroke in his father and mother. No Known Allergies Current Outpatient Prescriptions on File Prior to Visit  Medication Sig Dispense Refill  . ALPRAZolam (XANAX) 0.25 MG tablet Take 1 tablet (0.25 mg total) by mouth 2 (two) times daily as needed for anxiety.  40 tablet  0  . cyclobenzaprine (FLEXERIL) 10 MG tablet Take 1 tablet (10 mg total) by mouth 3 (three) times daily as needed for muscle spasms.  30 tablet  0  . HYDROcodone-acetaminophen (NORCO/VICODIN) 5-325 MG per tablet Take 1 tablet by mouth every 4 (four) hours as needed for moderate pain.  15 tablet  0  . lisinopril (PRINIVIL,ZESTRIL) 5 MG tablet Take 5 mg by mouth every morning.      Marland Kitchen lisinopril (PRINIVIL,ZESTRIL) 5 MG tablet take 1 tablet by mouth once daily  90 tablet  3  . metFORMIN (GLUCOPHAGE-XR) 500 MG 24 hr tablet Take 500 mg by mouth daily with breakfast.      . metFORMIN (GLUCOPHAGE-XR) 500 MG  24 hr tablet take 1 tablet by mouth once daily with food  90 tablet  3  . predniSONE (DELTASONE) 10 MG tablet Take 1 tablet (10 mg total) by mouth daily. 3 tabs by mouth per day for 3 days, then 2 tabs per day for 3 days, then 1 tab per day for 3 days, then stop  18 tablet  0  . sildenafil (VIAGRA) 100 MG tablet Take 1 tablet (100 mg total) by mouth daily as needed for erectile dysfunction.  3 tablet  0  . simvastatin (ZOCOR) 40 MG tablet take 1 tablet by mouth at bedtime  90 tablet  3  . traMADol (ULTRAM) 50 MG tablet Take 1 tablet (50 mg total) by mouth every 6 (six) hours as needed.  60 tablet  1  . vardenafil (LEVITRA) 20 MG tablet Take 1 tablet (20 mg total) by mouth daily as needed for erectile dysfunction. 1 by mouth every other day as needed  10 tablet  11   No current facility-administered medications on file prior to visit.   Review of Systems  Constitutional: Negative for unexpected weight change, or unusual diaphoresis  HENT: Negative for tinnitus.   Eyes: Negative for photophobia and visual disturbance.  Respiratory: Negative for choking and stridor.   Gastrointestinal: Negative for vomiting and blood in stool.  Genitourinary: Negative for hematuria and decreased urine volume.  Musculoskeletal: Negative for acute joint  swelling Skin: Negative for color change and wound.  Neurological: Negative for tremors and numbness other than noted  Psychiatric/Behavioral: Negative for decreased concentration or  hyperactivity.       Objective:   Physical Exam BP 122/70  Pulse 76  Temp(Src) 98.5 F (36.9 C) (Oral)  Ht 5\' 8"  (1.727 m)  Wt 251 lb (113.853 kg)  BMI 38.17 kg/m2  SpO2 97% VS noted, mild ill Constitutional: Pt appears well-developed and well-nourished.  HENT: Head: NCAT.  Right Ear: External ear normal.  Left Ear: External ear normal.  Bilat tm's with mild erythema.  Max sinus areas mild tender.  Pharynx with mild erythema, no exudate Eyes: Conjunctivae and EOM are  normal. Pupils are equal, round, and reactive to light.  Neck: Normal range of motion. Neck supple.  Cardiovascular: Normal rate and regular rhythm.   Pulmonary/Chest: Effort normal and breath sounds normal.  Abd:  Soft, NT, non-distended, + BS Neurological: Pt is alert. Not confused  Skin: Skin is warm. No erythema.  Psychiatric: Pt behavior is normal. Thought content normal.     Assessment & Plan:

## 2013-09-10 NOTE — Assessment & Plan Note (Signed)
Mild to mod, ? Viral - for tamillu course, for antibx course,  to f/u any worsening symptoms or concerns

## 2013-09-10 NOTE — Patient Instructions (Signed)
Please take all new medication as prescribed  Please continue all other medications as before, and refills have been done if requested.  Please have the pharmacy call with any other refills you may need.   

## 2013-09-10 NOTE — Assessment & Plan Note (Signed)
stable overall by history and exam, recent data reviewed with pt, and pt to continue medical treatment as before,  to f/u any worsening symptoms or concerns Lab Results  Component Value Date   HGBA1C 6.2 03/26/2012

## 2013-09-10 NOTE — Assessment & Plan Note (Signed)
stable overall by history and exam, recent data reviewed with pt, and pt to continue medical treatment as before,  to f/u any worsening symptoms or concerns' BP Readings from Last 3 Encounters:  09/10/13 122/70  05/09/13 120/70  05/05/13 147/83

## 2013-09-13 ENCOUNTER — Telehealth: Payer: Self-pay

## 2013-09-13 MED ORDER — HYDROCODONE-HOMATROPINE 5-1.5 MG/5ML PO SYRP: 5.0000 mL | ORAL_SOLUTION | Freq: Four times a day (QID) | ORAL | Status: DC | PRN

## 2013-09-13 NOTE — Telephone Encounter (Signed)
Phone call from patient [phone] requesting a refill on Hycodan. Please advise.

## 2013-09-13 NOTE — Telephone Encounter (Signed)
Done hardcopy to [facility]  

## 2013-09-13 NOTE — Telephone Encounter (Signed)
Called the patient informed hardcopy is at the front desk.

## 2013-09-19 ENCOUNTER — Telehealth: Payer: Self-pay

## 2013-09-19 NOTE — Telephone Encounter (Signed)
Relevant patient education assigned to patient using Emmi. ° °

## 2013-12-30 ENCOUNTER — Telehealth: Payer: Self-pay | Admitting: *Deleted

## 2013-12-30 DIAGNOSIS — E119 Type 2 diabetes mellitus without complications: Secondary | ICD-10-CM

## 2013-12-30 NOTE — Telephone Encounter (Signed)
Patient scheduled to come in for follow up DM, Lipid, HTN A1c, bmet, lipid ordered Diabetic bundle

## 2014-01-15 ENCOUNTER — Ambulatory Visit: Payer: PRIVATE HEALTH INSURANCE | Admitting: Internal Medicine

## 2014-01-22 ENCOUNTER — Ambulatory Visit: Payer: PRIVATE HEALTH INSURANCE | Admitting: Internal Medicine

## 2014-07-23 ENCOUNTER — Other Ambulatory Visit: Payer: Self-pay | Admitting: Internal Medicine

## 2014-09-04 ENCOUNTER — Telehealth: Payer: Self-pay | Admitting: Internal Medicine

## 2014-09-04 NOTE — Telephone Encounter (Signed)
Called patient back to see what medication he wants to be placed on since the original message was not clear.

## 2014-09-04 NOTE — Telephone Encounter (Signed)
Patient states tramadol is making him nauseous.  He is requesting to go back on Tramadol.

## 2014-09-05 ENCOUNTER — Telehealth: Payer: Self-pay | Admitting: Internal Medicine

## 2014-09-05 DIAGNOSIS — G894 Chronic pain syndrome: Secondary | ICD-10-CM

## 2014-09-05 NOTE — Telephone Encounter (Signed)
Very sorry, I no longer prescribed hydrocodone for long term pain control  I can refer pt to pain management if he wants to do this. thanks

## 2014-09-05 NOTE — Telephone Encounter (Signed)
Pt called in said that the Tramadol was making him sick and he wanted to go back on the hydrocodone.

## 2014-09-05 NOTE — Telephone Encounter (Signed)
Called patient at home but received no answer and could not leave a message. Will attempt to call later.

## 2014-09-08 NOTE — Telephone Encounter (Signed)
Pt called back in.  Please contact him on his cell number

## 2014-09-09 ENCOUNTER — Telehealth: Payer: Self-pay | Admitting: Internal Medicine

## 2014-09-09 NOTE — Telephone Encounter (Signed)
Pt called in said that he does not want to be referred to a pain clinc.  He said that he uses hydrocodone as needed.  He stated that a a month worth would las him a while.  He said that he only takes it as needed .  I told him I would

## 2014-09-10 NOTE — Telephone Encounter (Signed)
Ok for referral - done 

## 2014-09-10 NOTE — Telephone Encounter (Signed)
Patient would like a referral to pain management since he can not get the hydrocodone from here.

## 2014-10-22 ENCOUNTER — Other Ambulatory Visit: Payer: Self-pay | Admitting: Internal Medicine

## 2014-10-27 ENCOUNTER — Telehealth: Payer: Self-pay | Admitting: Internal Medicine

## 2014-10-27 NOTE — Telephone Encounter (Signed)
Peter Schmidt spoke to patient and advised

## 2014-10-27 NOTE — Telephone Encounter (Signed)
It would appear that his PCP ordered labs for him several times and he never bothered to do any since 2013. No recent labs to allow for renewal of prescriptions. He can see if his PCP will fill tomorrow when he returns. None of these medications is life threatening and can do him harm if left unmonitored.

## 2014-10-27 NOTE — Telephone Encounter (Signed)
metFORMIN (GLUCOPHAGE-XR) 500 MG 24 hr tablet [Pharmacy Med Name: METFORMIN HCL ER 500 MG TABLET]  lisinopril (PRINIVIL,ZESTRIL) 5 MG tablet [Pharmacy Med Name: LISINOPRIL 5 MG TABLET]    simvastatin (ZOCOR) 40 MG tablet [Pharmacy Med Name: SIMVASTATIN 40 MG TABLET]   Patient has been denied meds contingent on appointment. States that he was never told that an appt was needed until today when he discovered that his refills had been denied. i have scheduled an upcoming appt, but patient is completley out of meds. Can we fill temporary script?

## 2014-10-28 ENCOUNTER — Telehealth: Payer: Self-pay | Admitting: Internal Medicine

## 2014-10-28 MED ORDER — SIMVASTATIN 40 MG PO TABS: 40.0000 mg | ORAL_TABLET | Freq: Every day | ORAL | Status: DC

## 2014-10-28 MED ORDER — METFORMIN HCL ER 500 MG PO TB24: ORAL_TABLET | ORAL | Status: DC

## 2014-10-28 MED ORDER — LISINOPRIL 5 MG PO TABS: 5.0000 mg | ORAL_TABLET | Freq: Every day | ORAL | Status: DC

## 2014-10-28 NOTE — Telephone Encounter (Signed)
Patient is very upset that his meds were stopped.  He feels like this is poor practice on Dr. Gwynn Burly behalf.  Patient scheduled an appointment yesterday for 5/11.  He is requesting his meds to get sent as soon as possible with a follow up call as soon as they are sent.  Patient states this is an emergency for him b/c he is out of his meds and currently has a headache.  Patient needs metformin, simvastatin, and lisinopril sent as soon as possible.

## 2014-10-28 NOTE — Telephone Encounter (Signed)
Pt was overdue for appt last saw md 08/2013. Sent 30 day until appt 11/05/14 to [facility]...Peter Schmidt

## 2014-11-05 ENCOUNTER — Other Ambulatory Visit: Payer: Self-pay

## 2014-11-05 ENCOUNTER — Ambulatory Visit (INDEPENDENT_AMBULATORY_CARE_PROVIDER_SITE_OTHER): Payer: 59 | Admitting: Internal Medicine

## 2014-11-05 ENCOUNTER — Encounter: Payer: Self-pay | Admitting: Internal Medicine

## 2014-11-05 ENCOUNTER — Other Ambulatory Visit: Payer: 59

## 2014-11-05 VITALS — BP 124/82 | HR 85 | Temp 98.3°F | Resp 18 | Ht 68.0 in | Wt 254.0 lb

## 2014-11-05 DIAGNOSIS — R7302 Impaired glucose tolerance (oral): Secondary | ICD-10-CM

## 2014-11-05 DIAGNOSIS — M47812 Spondylosis without myelopathy or radiculopathy, cervical region: Secondary | ICD-10-CM | POA: Diagnosis not present

## 2014-11-05 DIAGNOSIS — Z0000 Encounter for general adult medical examination without abnormal findings: Secondary | ICD-10-CM

## 2014-11-05 HISTORY — DX: Impaired glucose tolerance (oral): R73.02

## 2014-11-05 MED ORDER — VARDENAFIL HCL 20 MG PO TABS: 20.0000 mg | ORAL_TABLET | Freq: Every day | ORAL | Status: DC | PRN

## 2014-11-05 MED ORDER — SIMVASTATIN 40 MG PO TABS: 40.0000 mg | ORAL_TABLET | Freq: Every day | ORAL | Status: DC

## 2014-11-05 MED ORDER — METFORMIN HCL ER 500 MG PO TB24: 500.0000 mg | ORAL_TABLET | Freq: Every day | ORAL | Status: DC

## 2014-11-05 MED ORDER — HYDROCODONE-ACETAMINOPHEN 5-325 MG PO TABS: ORAL_TABLET | ORAL | Status: DC

## 2014-11-05 MED ORDER — LISINOPRIL 5 MG PO TABS: 5.0000 mg | ORAL_TABLET | Freq: Every day | ORAL | Status: DC

## 2014-11-05 NOTE — Assessment & Plan Note (Signed)
With chronic pain, pt cont's to defer surgury, ok for pain med as requested

## 2014-11-05 NOTE — Assessment & Plan Note (Signed)

## 2014-11-05 NOTE — Telephone Encounter (Signed)
Rx refills done per pt request

## 2014-11-05 NOTE — Progress Notes (Signed)
Pre visit review using our clinic review tool, if applicable. No additional management support is needed unless otherwise documented below in the visit note. 

## 2014-11-05 NOTE — Assessment & Plan Note (Signed)
Also for a1c,  to f/u any worsening symptoms or concerns

## 2014-11-05 NOTE — Progress Notes (Signed)
Subjective:    Patient ID: Peter Schmidt, male    DOB: 1956/04/14, 59 y.o.   MRN: [id number]  HPI  Here for wellness and f/u;  Overall doing ok;  Pt denies Chest pain, worsening SOB, DOE, wheezing, orthopnea, PND, worsening LE edema, palpitations, dizziness or syncope.  Pt denies neurological change such as new headache, facial or extremity weakness.  Pt denies polydipsia, polyuria, or low sugar symptoms. Pt states overall good compliance with treatment and medications, good tolerability, and has been trying to follow appropriate diet.  Pt denies worsening depressive symptoms, suicidal ideation or panic. No fever, night sweats, wt loss, loss of appetite, or other constitutional symptoms.  Pt states good ability with ADL's, has low fall risk, home safety reviewed and adequate, no other significant changes in hearing or vision, and only occasionally active with exercise.  Has chronic neck pain, tramadol casuing gi upset.  Would only take hydrocodone once daily Past Medical History  Diagnosis Date  . HEPATITIS B 06/01/2007  . DIABETES MELLITUS, TYPE II 03/24/2008  . HYPERLIPIDEMIA 07/03/2007  . TOBACCO ABUSE 06/01/2007  . HYPERTENSION 06/04/2007  . ALLERGIC RHINITIS 05/05/2009  . SPONDYLOSIS, CERVICAL 06/01/2007  . RECTAL BLEEDING, HX OF 06/01/2007   No past surgical history on file.  reports that he has been smoking.  He does not have any smokeless tobacco history on file. He reports that he does not drink alcohol or use illicit drugs. family history includes Alcohol abuse in his father; Cancer in his father; Diabetes in his mother; Heart disease in his mother; Stroke in his father and mother. No Known Allergies Current Outpatient Prescriptions on File Prior to Visit  Medication Sig Dispense Refill  . ALPRAZolam (XANAX) 0.25 MG tablet Take 1 tablet (0.25 mg total) by mouth 2 (two) times daily as needed for anxiety. 40 tablet 0  . cyclobenzaprine (FLEXERIL) 10 MG tablet Take 1 tablet (10 mg total) by  mouth 3 (three) times daily as needed for muscle spasms. 30 tablet 0  . HYDROcodone-acetaminophen (NORCO/VICODIN) 5-325 MG per tablet Take 1 tablet by mouth every 4 (four) hours as needed for moderate pain. 15 tablet 0  . lisinopril (PRINIVIL,ZESTRIL) 5 MG tablet Take 1 tablet (5 mg total) by mouth daily. 30 tablet 0  . metFORMIN (GLUCOPHAGE-XR) 500 MG 24 hr tablet take 1 tablet by mouth once daily with food 90 tablet 3  . metFORMIN (GLUCOPHAGE-XR) 500 MG 24 hr tablet take 1 tablet by mouth once daily with food 30 tablet 0  . sildenafil (VIAGRA) 100 MG tablet Take 1 tablet (100 mg total) by mouth daily as needed for erectile dysfunction. 3 tablet 0  . simvastatin (ZOCOR) 40 MG tablet Take 1 tablet (40 mg total) by mouth at bedtime. 30 tablet 0  . vardenafil (LEVITRA) 20 MG tablet Take 1 tablet (20 mg total) by mouth daily as needed for erectile dysfunction. 1 by mouth every other day as needed 10 tablet 11   No current facility-administered medications on file prior to visit.    Review of Systems Constitutional: Negative for increased diaphoresis, other activity, appetite or siginficant weight change other than noted HENT: Negative for worsening hearing loss, ear pain, facial swelling, mouth sores and neck stiffness.   Eyes: Negative for other worsening pain, redness or visual disturbance.  Respiratory: Negative for shortness of breath and wheezing  Cardiovascular: Negative for chest pain and palpitations.  Gastrointestinal: Negative for diarrhea, blood in stool, abdominal distention or other pain Genitourinary: Negative for hematuria, flank  pain or change in urine volume.  Musculoskeletal: Negative for myalgias or other joint complaints.  Skin: Negative for color change and wound or drainage.  Neurological: Negative for syncope and numbness. other than noted Hematological: Negative for adenopathy. or other swelling Psychiatric/Behavioral: Negative for hallucinations, SI, self-injury,  decreased concentration or other worsening agitation.      Objective:   Physical Exam .BP 124/82 mmHg  Pulse 85  Temp(Src) 98.3 F (36.8 C) (Oral)  Resp 18  Ht 5\' 8"  (1.727 m)  Wt 254 lb (115.214 kg)  BMI 38.63 kg/m2  SpO2 97% VS noted,  Constitutional: Pt is oriented to person, place, and time. Appears well-developed and well-nourished, in no significant distress Head: Normocephalic and atraumatic.  Right Ear: External ear normal.  Left Ear: External ear normal.  Nose: Nose normal.  Mouth/Throat: Oropharynx is clear and moist.  Eyes: Conjunctivae and EOM are normal. Pupils are equal, round, and reactive to light.  Neck: Normal range of motion. Neck supple. No JVD present. No tracheal deviation present or significant neck LA or mass Cardiovascular: Normal rate, regular rhythm, normal heart sounds and intact distal pulses.   Pulmonary/Chest: Effort normal and breath sounds without rales or wheezing  Abdominal: Soft. Bowel sounds are normal. NT. No HSM  Musculoskeletal: Normal range of motion. Exhibits no edema.  Lymphadenopathy:  Has no cervical adenopathy.  Neurological: Pt is alert and oriented to person, place, and time. Pt has normal reflexes. No cranial nerve deficit. Motor grossly intact Skin: Skin is warm and dry. No rash noted.  Psychiatric:  Has normal mood and affect. Behavior is normal.     Assessment & Plan:

## 2014-11-05 NOTE — Patient Instructions (Signed)
Please take all new medication as prescribed - the pain medication  Please continue all other medications as before, and refills have been done if requested.  Please have the pharmacy call with any other refills you may need.  Please continue your efforts at being more active, low cholesterol diet, and weight control.  You are otherwise up to date with prevention measures today.  Please keep your appointments with your specialists as you may have planned  Please go to the LAB in the Basement (turn left off the elevator) for the tests to be done today  You will be contacted by phone if any changes need to be made immediately.  Otherwise, you will receive a letter about your results with an explanation, but please check with MyChart first.  Please remember to sign up for MyChart if you have not done so, as this will be important to you in the future with finding out test results, communicating by private email, and scheduling acute appointments online when needed.  Please return in 1 year for your yearly visit, or sooner if needed, with Lab testing done 3-5 days before

## 2015-02-26 ENCOUNTER — Other Ambulatory Visit: Payer: Self-pay

## 2015-02-26 MED ORDER — HYDROCODONE-ACETAMINOPHEN 5-325 MG PO TABS: ORAL_TABLET | ORAL | Status: DC

## 2015-02-26 NOTE — Telephone Encounter (Signed)
Done hardcopy to Dahlia  

## 2015-02-26 NOTE — Telephone Encounter (Signed)
Pt informed, Rx in cabinet for pt pick up  

## 2015-07-01 ENCOUNTER — Telehealth: Payer: Self-pay | Admitting: *Deleted

## 2015-07-01 MED ORDER — HYDROCODONE-ACETAMINOPHEN 5-325 MG PO TABS: ORAL_TABLET | ORAL | Status: DC

## 2015-07-01 NOTE — Telephone Encounter (Signed)
Done hardcopy to Dahlia  

## 2015-07-01 NOTE — Telephone Encounter (Signed)
Received call pt requesting refill on his hydrocodone...Peter Schmidt

## 2015-07-02 NOTE — Telephone Encounter (Signed)
Notified pt rx ready for pick-up.../lmb 

## 2015-08-12 ENCOUNTER — Telehealth: Payer: Self-pay | Admitting: Internal Medicine

## 2015-08-12 ENCOUNTER — Ambulatory Visit (INDEPENDENT_AMBULATORY_CARE_PROVIDER_SITE_OTHER): Payer: 59 | Admitting: Internal Medicine

## 2015-08-12 ENCOUNTER — Encounter: Payer: Self-pay | Admitting: Internal Medicine

## 2015-08-12 ENCOUNTER — Ambulatory Visit: Payer: Self-pay | Admitting: Internal Medicine

## 2015-08-12 VITALS — BP 142/82 | HR 97 | Temp 98.5°F | Resp 20 | Wt 260.0 lb

## 2015-08-12 DIAGNOSIS — E785 Hyperlipidemia, unspecified: Secondary | ICD-10-CM

## 2015-08-12 DIAGNOSIS — M47812 Spondylosis without myelopathy or radiculopathy, cervical region: Secondary | ICD-10-CM | POA: Diagnosis not present

## 2015-08-12 DIAGNOSIS — R7302 Impaired glucose tolerance (oral): Secondary | ICD-10-CM | POA: Diagnosis not present

## 2015-08-12 DIAGNOSIS — Z720 Tobacco use: Secondary | ICD-10-CM

## 2015-08-12 DIAGNOSIS — I10 Essential (primary) hypertension: Secondary | ICD-10-CM

## 2015-08-12 DIAGNOSIS — Z0000 Encounter for general adult medical examination without abnormal findings: Secondary | ICD-10-CM

## 2015-08-12 DIAGNOSIS — F17200 Nicotine dependence, unspecified, uncomplicated: Secondary | ICD-10-CM

## 2015-08-12 MED ORDER — METFORMIN HCL ER 500 MG PO TB24: 500.0000 mg | ORAL_TABLET | Freq: Every day | ORAL | Status: DC

## 2015-08-12 MED ORDER — ALPRAZOLAM 0.25 MG PO TABS: 0.2500 mg | ORAL_TABLET | Freq: Two times a day (BID) | ORAL | Status: DC | PRN

## 2015-08-12 MED ORDER — ROSUVASTATIN CALCIUM 20 MG PO TABS: 20.0000 mg | ORAL_TABLET | Freq: Every day | ORAL | Status: DC

## 2015-08-12 MED ORDER — VARENICLINE TARTRATE 0.5 MG X 11 & 1 MG X 42 PO MISC: ORAL | Status: DC

## 2015-08-12 MED ORDER — VARENICLINE TARTRATE 1 MG PO TABS: 1.0000 mg | ORAL_TABLET | Freq: Two times a day (BID) | ORAL | Status: DC

## 2015-08-12 MED ORDER — VALSARTAN 80 MG PO TABS: 80.0000 mg | ORAL_TABLET | Freq: Every day | ORAL | Status: DC

## 2015-08-12 NOTE — Progress Notes (Signed)
Subjective:    Patient ID: Peter Schmidt, male    DOB: 1956-02-04, 60 y.o.   MRN: [id number]  HPI  Here for wellness and f/u;  Overall doing ok;  Pt denies Chest pain, worsening SOB, DOE, wheezing, orthopnea, PND, worsening LE edema, palpitations, dizziness or syncope.  Pt denies neurological change such as new headache, facial or extremity weakness.  Pt denies polydipsia, polyuria, or low sugar symptoms. Pt states overall good compliance with treatment and medications, good tolerability, and has been trying to follow appropriate diet.  Pt denies worsening depressive symptoms, suicidal ideation or panic. No fever, night sweats, wt loss, loss of appetite, or other constitutional symptoms.  Pt states good ability with ADL's, has low fall risk, home safety reviewed and adequate, no other significant changes in hearing or vision, and only occasionally active with exercise.  C/o dry cough, months, ? Sinus related Not recently taking the glucophage or zocor, admits to noncompliance, no side effects Wt Readings from Last 3 Encounters:  08/12/15 260 lb (117.935 kg)  11/05/14 254 lb (115.214 kg)  09/10/13 251 lb (113.853 kg)  Due for flu shot, wants to quit smoking, asks for chantix.  Pt continues to have recurring neck pain without change in severity, bowel or bladder change, fever, wt loss,  worsening LE pain/numbness/weakness, gait change or falls. Past Medical History  Diagnosis Date  . HEPATITIS B 06/01/2007  . DIABETES MELLITUS, TYPE II 03/24/2008  . HYPERLIPIDEMIA 07/03/2007  . TOBACCO ABUSE 06/01/2007  . HYPERTENSION 06/04/2007  . ALLERGIC RHINITIS 05/05/2009  . SPONDYLOSIS, CERVICAL 06/01/2007  . RECTAL BLEEDING, HX OF 06/01/2007  . Impaired glucose tolerance 11/05/2014   No past surgical history on file.  reports that he has been smoking.  He does not have any smokeless tobacco history on file. He reports that he does not drink alcohol or use illicit drugs. family history includes Alcohol abuse in his  father; Cancer in his father; Diabetes in his mother; Heart disease in his mother; Stroke in his father and mother. No Known Allergies Current Outpatient Prescriptions on File Prior to Visit  Medication Sig Dispense Refill  . cyclobenzaprine (FLEXERIL) 10 MG tablet Take 1 tablet (10 mg total) by mouth 3 (three) times daily as needed for muscle spasms. 30 tablet 0  . HYDROcodone-acetaminophen (NORCO/VICODIN) 5-325 MG tablet 1 tab by mouth per day as needed 30 tablet 0  . sildenafil (VIAGRA) 100 MG tablet Take 1 tablet (100 mg total) by mouth daily as needed for erectile dysfunction. 3 tablet 0  . vardenafil (LEVITRA) 20 MG tablet Take 1 tablet (20 mg total) by mouth daily as needed for erectile dysfunction. 1 by mouth every other day as needed 10 tablet 11   No current facility-administered medications on file prior to visit.    Review of Systems Constitutional: Negative for increased diaphoresis, other activity, appetite or siginficant weight change other than noted HENT: Negative for worsening hearing loss, ear pain, facial swelling, mouth sores and neck stiffness.   Eyes: Negative for other worsening pain, redness or visual disturbance.  Respiratory: Negative for shortness of breath and wheezing  Cardiovascular: Negative for chest pain and palpitations.  Gastrointestinal: Negative for diarrhea, blood in stool, abdominal distention or other pain Genitourinary: Negative for hematuria, flank pain or change in urine volume.  Musculoskeletal: Negative for myalgias or other joint complaints.  Skin: Negative for color change and wound or drainage.  Neurological: Negative for syncope and numbness. other than noted Hematological: Negative for adenopathy. or  other swelling Psychiatric/Behavioral: Negative for hallucinations, SI, self-injury, decreased concentration or other worsening agitation.      Objective:   Physical Exam BP 142/82 mmHg  Pulse 97  Temp(Src) 98.5 F (36.9 C) (Oral)  Resp  20  Wt 260 lb (117.935 kg)  SpO2 94% VS noted, obese Constitutional: Pt is oriented to person, place, and time. Appears well-developed and well-nourished, in no significant distress Head: Normocephalic and atraumatic.  Right Ear: External ear normal.  Left Ear: External ear normal.  Nose: Nose normal.  Mouth/Throat: Oropharynx is clear and moist.  Eyes: Conjunctivae and EOM are normal. Pupils are equal, round, and reactive to light.  Neck: Normal range of motion. Neck supple. No JVD present. No tracheal deviation present or significant neck LA or mass Cardiovascular: Normal rate, regular rhythm, normal heart sounds and intact distal pulses.   Pulmonary/Chest: Effort normal and breath sounds without rales or wheezing  Abdominal: Soft. Bowel sounds are normal. NT. No HSM  Musculoskeletal: Normal range of motion. Exhibits no edema.  Lymphadenopathy:  Has no cervical adenopathy.  Neurological: Pt is alert and oriented to person, place, and time. Pt has normal reflexes. No cranial nerve deficit. Motor grossly intact Skin: Skin is warm and dry. No rash noted.  Psychiatric:  Has normal mood and affect. Behavior is normal.     Assessment & Plan:

## 2015-08-12 NOTE — Telephone Encounter (Signed)
[facility] - Client °TELEPHONE ADVICE RECORD °[facility] ° °Patient Name: Peter Schmidt  °DOB: 01/19/1956   ° °Initial Comment Caller stated his blood pressure is 138/97 and he is having headaches.   ° °Nurse Assessment  °Nurse: Patten, RN, Kendra Date/Time (Eastern Time): 08/12/2015 3:07:35 PM  °Confirm and document reason for call. If symptomatic, describe symptoms. You must click the next button to save text entered. ---Caller states his BP is elevated. He woke up with a headache at 4am. Caller states he is currently having a tooth infection and is on pain medication and antibiotics. BP was 173/97 at the drug store. Caller states his BP is 138/99.  °Has the patient traveled out of the country within the last 30 days? ---Not Applicable  °Does the patient have any new or worsening symptoms? ---Yes  °Will a triage be completed? ---Yes  °Related visit to physician within the last 2 weeks? ---No  °Does the PT have any chronic conditions? (i.e. diabetes, asthma, etc.) ---Yes  °List chronic conditions. ---HTN  °Is this a behavioral health or substance abuse call? ---No  °  ° °Guidelines    °Guideline Title Affirmed Question Affirmed Notes  °High Blood Pressure [1] BP ? 140/90 AND [2] taking BP medications   ° °Final Disposition User   °See PCP within 2 Weeks Patten, RN, Kendra   ° °Comments  °Caller scheduled for today, 02/15, at 545p  ° °Referrals  °REFERRED TO PCP OFFICE  ° °Disagree/Comply: Comply  ° ° °

## 2015-08-12 NOTE — Patient Instructions (Addendum)
You had the flu shot today  Ok to stop the lisinopril, and stop the simvastatin (zocor)  Please take all new medication as prescribed - the Diovan 80 mg per day, and the Crestor 20 mg per day  Please take all new medication as prescribed  - the chantix done in hardcopy today  Please continue all other medications as before, including the metformin  Please have the pharmacy call with any other refills you may need.  Please continue your efforts at being more active, low cholesterol diet, and weight control.  You are otherwise up to date with prevention measures today.  You will be contacted regarding the referral for: Dr Smith/sports medicine (for the neck)  Please keep your appointments with your specialists as you may have planned  Please go to the LAB in the Basement (turn left off the elevator) for the tests to be done tomorrow4 You will be contacted by phone if any changes need to be made immediately.  Otherwise, you will receive a letter about your results with an explanation, but please check with MyChart first.  Please remember to sign up for MyChart if you have not done so, as this will be important to you in the future with finding out test results, communicating by private email, and scheduling acute appointments online when needed.  Please return in 6 months, or sooner if needed, with Lab testing done 3-5 days before

## 2015-08-12 NOTE — Progress Notes (Signed)
Pre visit review using our clinic review tool, if applicable. No additional management support is needed unless otherwise documented below in the visit note. 

## 2015-08-13 ENCOUNTER — Other Ambulatory Visit (INDEPENDENT_AMBULATORY_CARE_PROVIDER_SITE_OTHER): Payer: 59

## 2015-08-13 DIAGNOSIS — R7302 Impaired glucose tolerance (oral): Secondary | ICD-10-CM | POA: Diagnosis not present

## 2015-08-13 DIAGNOSIS — Z0000 Encounter for general adult medical examination without abnormal findings: Secondary | ICD-10-CM

## 2015-08-13 LAB — CBC WITH DIFFERENTIAL/PLATELET
BASOS PCT: 0.5 % (ref 0.0–3.0)
Basophils Absolute: 0 10*3/uL (ref 0.0–0.1)
EOS ABS: 0.1 10*3/uL (ref 0.0–0.7)
Eosinophils Relative: 2 % (ref 0.0–5.0)
HCT: 44.3 % (ref 39.0–52.0)
Hemoglobin: 14.9 g/dL (ref 13.0–17.0)
LYMPHS ABS: 2.1 10*3/uL (ref 0.7–4.0)
Lymphocytes Relative: 30.8 % (ref 12.0–46.0)
MCHC: 33.7 g/dL (ref 30.0–36.0)
MCV: 80.2 fl (ref 78.0–100.0)
MONO ABS: 0.6 10*3/uL (ref 0.1–1.0)
Monocytes Relative: 8.3 % (ref 3.0–12.0)
NEUTROS ABS: 3.9 10*3/uL (ref 1.4–7.7)
Neutrophils Relative %: 58.4 % (ref 43.0–77.0)
PLATELETS: 248 10*3/uL (ref 150.0–400.0)
RBC: 5.53 Mil/uL (ref 4.22–5.81)
RDW: 14.7 % (ref 11.5–15.5)
WBC: 6.7 10*3/uL (ref 4.0–10.5)

## 2015-08-13 LAB — LIPID PANEL
CHOLESTEROL: 245 mg/dL — AB (ref 0–200)
HDL: 64.7 mg/dL (ref >39.00)
LDL Cholesterol: 159 mg/dL — ABNORMAL HIGH (ref 0–99)
NONHDL: 180.57
Total CHOL/HDL Ratio: 4
Triglycerides: 110 mg/dL (ref 0.0–149.0)
VLDL: 22 mg/dL (ref 0.0–40.0)

## 2015-08-13 LAB — BASIC METABOLIC PANEL
BUN: 8 mg/dL (ref 6–23)
CO2: 31 meq/L (ref 19–32)
Calcium: 9.7 mg/dL (ref 8.4–10.5)
Chloride: 100 mEq/L (ref 96–112)
Creatinine, Ser: 0.82 mg/dL (ref 0.40–1.50)
GFR: 123.98 mL/min (ref >60.00)
GLUCOSE: 102 mg/dL — AB (ref 70–99)
POTASSIUM: 4.5 meq/L (ref 3.5–5.1)
SODIUM: 137 meq/L (ref 135–145)

## 2015-08-13 LAB — TSH: TSH: 0.66 u[IU]/mL (ref 0.35–4.50)

## 2015-08-13 LAB — URINALYSIS, ROUTINE W REFLEX MICROSCOPIC
Bilirubin Urine: NEGATIVE
Hgb urine dipstick: NEGATIVE
Ketones, ur: NEGATIVE
Leukocytes, UA: NEGATIVE
Nitrite: NEGATIVE
RBC / HPF: NONE SEEN (ref 0–?)
SPECIFIC GRAVITY, URINE: 1.02 (ref 1.000–1.030)
TOTAL PROTEIN, URINE-UPE24: NEGATIVE
URINE GLUCOSE: NEGATIVE
Urobilinogen, UA: 0.2 (ref 0.0–1.0)
pH: 7 (ref 5.0–8.0)

## 2015-08-13 LAB — HEPATIC FUNCTION PANEL
ALK PHOS: 69 U/L (ref 39–117)
ALT: 19 U/L (ref 0–53)
AST: 17 U/L (ref 0–37)
Albumin: 4.4 g/dL (ref 3.5–5.2)
BILIRUBIN DIRECT: 0.1 mg/dL (ref 0.0–0.3)
Total Bilirubin: 0.5 mg/dL (ref 0.2–1.2)
Total Protein: 7.3 g/dL (ref 6.0–8.3)

## 2015-08-13 LAB — PSA: PSA: 0.32 ng/mL (ref 0.10–4.00)

## 2015-08-13 LAB — HEMOGLOBIN A1C: HEMOGLOBIN A1C: 6.4 % (ref 4.6–6.5)

## 2015-08-14 ENCOUNTER — Encounter: Payer: Self-pay | Admitting: Internal Medicine

## 2015-08-17 DIAGNOSIS — F17200 Nicotine dependence, unspecified, uncomplicated: Secondary | ICD-10-CM | POA: Insufficient documentation

## 2015-08-17 NOTE — Assessment & Plan Note (Signed)
stable overall by history and exam, recent data reviewed with pt, and pt to start diovan 80 qd, o/w continue medical treatment as before,  to f/u any worsening symptoms or concerns BP Readings from Last 3 Encounters:  08/12/15 142/82  11/05/14 124/82  09/10/13 122/70

## 2015-08-17 NOTE — Assessment & Plan Note (Signed)
With persistent pain, for referral sports medicine, cont pain control,  to f/u any worsening symptoms or concerns

## 2015-08-17 NOTE — Assessment & Plan Note (Signed)
Ok for chantix asd,  to f/u any worsening symptoms or concerns 

## 2015-08-17 NOTE — Assessment & Plan Note (Signed)
stable overall by history and exam, recent data reviewed with pt, and pt to continue medical treatment as before - re-start metformin,  to f/u any worsening symptoms or concerns Lab Results  Component Value Date   HGBA1C 6.4 08/13/2015

## 2015-08-17 NOTE — Assessment & Plan Note (Signed)

## 2015-08-17 NOTE — Assessment & Plan Note (Signed)
stable overall by history and exam, recent data reviewed with pt, and pt to start crestor 20, o/w continue medical treatment as before,  to f/u any worsening symptoms or concerns Lab Results  Component Value Date   LDLCALC 159* 08/13/2015

## 2015-09-03 ENCOUNTER — Ambulatory Visit: Payer: 59 | Admitting: Family Medicine

## 2015-09-24 ENCOUNTER — Telehealth: Payer: Self-pay | Admitting: Internal Medicine

## 2015-09-24 NOTE — Telephone Encounter (Signed)
Pt requesting refill for HYDROcodone-acetaminophen (NORCO/VICODIN) 5-325 MG tablet [id number]

## 2015-09-25 MED ORDER — HYDROCODONE-ACETAMINOPHEN 5-325 MG PO TABS: ORAL_TABLET | ORAL | Status: DC

## 2015-09-25 NOTE — Telephone Encounter (Signed)
Please advise are we able to refill this?

## 2015-09-25 NOTE — Telephone Encounter (Signed)
Done hardcopy to Corinne  

## 2015-09-28 ENCOUNTER — Other Ambulatory Visit: Payer: Self-pay | Admitting: Family

## 2015-09-28 MED ORDER — HYDROCODONE-ACETAMINOPHEN 5-325 MG PO TABS: ORAL_TABLET | ORAL | Status: DC

## 2015-12-08 ENCOUNTER — Telehealth: Payer: Self-pay | Admitting: *Deleted

## 2015-12-08 MED ORDER — HYDROCODONE-ACETAMINOPHEN 5-325 MG PO TABS: ORAL_TABLET | ORAL | Status: DC

## 2015-12-08 NOTE — Telephone Encounter (Signed)
Requesting refill on pain med Hydrocodone...Peter Schmidt

## 2015-12-08 NOTE — Telephone Encounter (Signed)
Done hardcopy to Corinne  

## 2015-12-09 NOTE — Telephone Encounter (Signed)
Notified pt rx ready for pick-up.../lmb 

## 2016-02-17 ENCOUNTER — Telehealth: Payer: Self-pay

## 2016-02-17 ENCOUNTER — Ambulatory Visit (INDEPENDENT_AMBULATORY_CARE_PROVIDER_SITE_OTHER): Payer: 59 | Admitting: Internal Medicine

## 2016-02-17 ENCOUNTER — Encounter: Payer: Self-pay | Admitting: Internal Medicine

## 2016-02-17 VITALS — BP 140/78 | HR 90 | Temp 98.7°F | Resp 20 | Wt 270.0 lb

## 2016-02-17 DIAGNOSIS — R7302 Impaired glucose tolerance (oral): Secondary | ICD-10-CM

## 2016-02-17 DIAGNOSIS — R6889 Other general symptoms and signs: Secondary | ICD-10-CM

## 2016-02-17 DIAGNOSIS — Z0001 Encounter for general adult medical examination with abnormal findings: Secondary | ICD-10-CM | POA: Diagnosis not present

## 2016-02-17 DIAGNOSIS — I10 Essential (primary) hypertension: Secondary | ICD-10-CM | POA: Diagnosis not present

## 2016-02-17 DIAGNOSIS — R21 Rash and other nonspecific skin eruption: Secondary | ICD-10-CM | POA: Diagnosis not present

## 2016-02-17 MED ORDER — KETOCONAZOLE 2 % EX CREA: 1.0000 "application " | TOPICAL_CREAM | Freq: Every day | CUTANEOUS | 1 refills | Status: DC

## 2016-02-17 MED ORDER — HYDROCODONE-ACETAMINOPHEN 5-325 MG PO TABS: ORAL_TABLET | ORAL | 0 refills | Status: DC

## 2016-02-17 MED ORDER — PHENTERMINE HCL 37.5 MG PO CAPS: 37.5000 mg | ORAL_CAPSULE | ORAL | 2 refills | Status: DC

## 2016-02-17 MED ORDER — NYSTATIN 100000 UNIT/GM EX POWD: CUTANEOUS | 11 refills | Status: DC

## 2016-02-17 NOTE — Progress Notes (Signed)
Pre visit review using our clinic review tool, if applicable. No additional management support is needed unless otherwise documented below in the visit note. 

## 2016-02-17 NOTE — Progress Notes (Signed)
Subjective:    Patient ID: Peter Schmidt, male    DOB: [birth year], 60 y.o.   MRN: [id number]  HPI  Here with c/o scaly whitish rash to feet after sweating in socks for many days recently in warm weather, mild, constant, nothing else makes better or worse.  Also has an itchy scaly rash erythem but nontender to beltline as well wear stomach overlaps.  Has cont'd to gain wt, getting desparate for something else "besides diet and exercise."   Wt Readings from Last 3 Encounters:  02/17/16 270 lb (122.5 kg)  08/12/15 260 lb (117.9 kg)  11/05/14 254 lb (115.2 kg)  Pt denies chest pain, increased sob or doe, wheezing, orthopnea, PND, increased LE swelling, palpitations, dizziness or syncope.  Pt denies new neurological symptoms such as new headache, or facial or extremity weakness or numbness   Pt denies polydipsia, polyuria Past Medical History:  Diagnosis Date  . ALLERGIC RHINITIS 05/05/2009  . DIABETES MELLITUS, TYPE II 03/24/2008  . HEPATITIS B 06/01/2007  . HYPERLIPIDEMIA 07/03/2007  . HYPERTENSION 06/04/2007  . Impaired glucose tolerance 11/05/2014  . RECTAL BLEEDING, HX OF 06/01/2007  . SPONDYLOSIS, CERVICAL 06/01/2007  . TOBACCO ABUSE 06/01/2007   No past surgical history on file.  reports that he has been smoking.  He does not have any smokeless tobacco history on file. He reports that he does not drink alcohol or use drugs. family history includes Alcohol abuse in his father; Cancer in his father; Diabetes in his mother; Heart disease in his mother; Stroke in his father and mother. No Known Allergies Current Outpatient Prescriptions on File Prior to Visit  Medication Sig Dispense Refill  . ALPRAZolam (XANAX) 0.25 MG tablet Take 1 tablet (0.25 mg total) by mouth 2 (two) times daily as needed for anxiety. 40 tablet 1  . cyclobenzaprine (FLEXERIL) 10 MG tablet Take 1 tablet (10 mg total) by mouth 3 (three) times daily as needed for muscle spasms. 30 tablet 0  . metFORMIN (GLUCOPHAGE-XR) 500 MG 24  hr tablet Take 1 tablet (500 mg total) by mouth daily with breakfast. 90 tablet 3  . rosuvastatin (CRESTOR) 20 MG tablet Take 1 tablet (20 mg total) by mouth daily. 90 tablet 3  . sildenafil (VIAGRA) 100 MG tablet Take 1 tablet (100 mg total) by mouth daily as needed for erectile dysfunction. 3 tablet 0  . valsartan (DIOVAN) 80 MG tablet Take 1 tablet (80 mg total) by mouth daily. 90 tablet 3  . vardenafil (LEVITRA) 20 MG tablet Take 1 tablet (20 mg total) by mouth daily as needed for erectile dysfunction. 1 by mouth every other day as needed 10 tablet 11  . varenicline (CHANTIX CONTINUING MONTH PAK) 1 MG tablet Take 1 tablet (1 mg total) by mouth 2 (two) times daily. 60 tablet 1  . varenicline (CHANTIX PAK) 0.5 MG X 11 & 1 MG X 42 tablet Take one 0.5 mg tablet by mouth once daily for 3 days, then increase to one 0.5 mg tablet twice daily for 4 days, then increase to one 1 mg tablet twice daily. 53 tablet 0   No current facility-administered medications on file prior to visit.    Review of Systems  Constitutional: Negative for unusual diaphoresis or night sweats HENT: Negative for ear swelling or discharge Eyes: Negative for worsening visual haziness  Respiratory: Negative for choking and stridor.   Gastrointestinal: Negative for distension or worsening eructation Genitourinary: Negative for retention or change in urine volume.  Musculoskeletal: Negative for  other MSK pain or swelling Skin: Negative for color change and worsening wound Neurological: Negative for tremors and numbness other than noted  Psychiatric/Behavioral: Negative for decreased concentration or agitation other than above       Objective:   Physical Exam BP 140/78   Pulse 90   Temp 98.7 F (37.1 C) (Oral)   Resp 20   Wt 270 lb (122.5 kg)   SpO2 96%   BMI 41.05 kg/m  VS noted,  Constitutional: Pt appears in no apparent distress HENT: Head: NCAT.  Right Ear: External ear normal.  Left Ear: External ear normal.    Eyes: . Pupils are equal, round, and reactive to light. Conjunctivae and EOM are normal Neck: Normal range of motion. Neck supple.  Cardiovascular: Normal rate and regular rhythm.   Pulmonary/Chest: Effort normal and breath sounds without rales or wheezing.  Neurological: Pt is alert. Not confused , motor grossly intact Skin: Skin is warm, no LE edema, + erythem nontender scaly rash to beltline without swelling or tenderness and somewhat scaly Also bilat feet with scaly white rash about the medial and lateral feet but not dorsal or plantar Psychiatric: Pt behavior is normal. No agitation.     Assessment & Plan:

## 2016-02-17 NOTE — Assessment & Plan Note (Signed)
Fonda for limited phentermine x 3 mo asd, but for long term increased in exercise, and calorie reduction

## 2016-02-17 NOTE — Assessment & Plan Note (Signed)
stable overall by history and exam, recent data reviewed with pt, and pt to continue medical treatment as before,  to f/u any worsening symptoms or concerns BP Readings from Last 3 Encounters:  02/17/16 140/78  08/12/15 (!) 142/82  11/05/14 124/82

## 2016-02-17 NOTE — Patient Instructions (Signed)
Please take all new medication as prescribed - the cream, powder and phentermine  Please continue all other medications as before, and refills have been done if requested.  Please have the pharmacy call with any other refills you may need.  Please continue your efforts at being more active, low cholesterol diet, and weight control.  Please keep your appointments with your specialists as you may have planned

## 2016-02-17 NOTE — Telephone Encounter (Signed)
A user error has taken place.

## 2016-02-17 NOTE — Assessment & Plan Note (Signed)
stable overall by history and exam, recent data reviewed with pt, and pt to continue medical treatment as before,  to f/u any worsening symptoms or concerns Lab Results  Component Value Date   HGBA1C 6.4 08/13/2015

## 2016-02-17 NOTE — Assessment & Plan Note (Signed)
C/w prob fungal - for ketoconzole cr prn to feet, nystatin powder to beltline area,  to f/u any worsening symptoms or concerns

## 2016-03-28 ENCOUNTER — Telehealth: Payer: Self-pay | Admitting: *Deleted

## 2016-03-28 NOTE — Telephone Encounter (Signed)
Pt left msg on triage stating needing refill on Hydrocodone. MD out of office today will hold until he return tomorrow...Peter Schmidt

## 2016-03-29 MED ORDER — HYDROCODONE-ACETAMINOPHEN 5-325 MG PO TABS: ORAL_TABLET | ORAL | 0 refills | Status: DC

## 2016-03-29 NOTE — Telephone Encounter (Signed)
Done hardcopy to Corinne  

## 2016-03-29 NOTE — Telephone Encounter (Signed)
Called pt no answer [facility] rx ready for pick-up.../lmb 

## 2016-05-24 ENCOUNTER — Telehealth: Payer: Self-pay | Admitting: *Deleted

## 2016-05-24 MED ORDER — HYDROCODONE-ACETAMINOPHEN 5-325 MG PO TABS: ORAL_TABLET | ORAL | 0 refills | Status: DC

## 2016-05-24 NOTE — Telephone Encounter (Signed)
Requesting refill on his Hydrocodone...Peter Schmidt

## 2016-05-24 NOTE — Telephone Encounter (Signed)
Done hardcopy to Corinne  

## 2016-05-25 NOTE — Telephone Encounter (Signed)
Notified pt rx ready for pick-up. Pt also states he think he has pink eye. Started running, red, and itching. Inform pt he would need ov to be evaluated. Made appt for tomorrow @ 11:00...Peter Schmidt

## 2016-05-26 ENCOUNTER — Encounter: Payer: Self-pay | Admitting: Internal Medicine

## 2016-05-26 ENCOUNTER — Ambulatory Visit (INDEPENDENT_AMBULATORY_CARE_PROVIDER_SITE_OTHER): Payer: 59 | Admitting: Internal Medicine

## 2016-05-26 VITALS — BP 136/76 | HR 90 | Resp 20 | Wt 275.0 lb

## 2016-05-26 DIAGNOSIS — J309 Allergic rhinitis, unspecified: Secondary | ICD-10-CM

## 2016-05-26 DIAGNOSIS — I10 Essential (primary) hypertension: Secondary | ICD-10-CM

## 2016-05-26 DIAGNOSIS — H1033 Unspecified acute conjunctivitis, bilateral: Secondary | ICD-10-CM

## 2016-05-26 MED ORDER — AZELASTINE HCL 0.05 % OP SOLN: 1.0000 [drp] | Freq: Two times a day (BID) | OPHTHALMIC | 12 refills | Status: DC | PRN

## 2016-05-26 MED ORDER — AZITHROMYCIN 250 MG PO TABS: ORAL_TABLET | ORAL | 1 refills | Status: DC

## 2016-05-26 NOTE — Progress Notes (Signed)
Pre visit review using our clinic review tool, if applicable. No additional management support is needed unless otherwise documented below in the visit note. 

## 2016-05-26 NOTE — Patient Instructions (Addendum)
You had the steroid shot today  Please take all new medication as prescribed - the antibiotic, and the eye drops  Please continue all other medications as before, and refills have been done if requested.  Please have the pharmacy call with any other refills you may need.  Please continue your efforts at being more active, low cholesterol diet, and weight control.  Please keep your appointments with your specialists as you may have planned

## 2016-05-26 NOTE — Progress Notes (Signed)
Subjective:    Patient ID: Peter Schmidt, male    DOB: 09-01-1955, 60 y.o.   MRN: [id number]  HPI  Here with 3-4 days onset bilat eye discomfort, swelling/puffy, redness and d/c, with HA, general weakness and malaise, but no sinus pain, pressure, fever, ST, cough and Pt denies chest pain, increased sob or doe, wheezing, orthopnea, PND, increased LE swelling, palpitations, dizziness or syncope.  Does also have several wks ongoing nasal allergy symptoms with clearish congestion, itch and sneezing, without fever, pain, ST, cough, swelling or wheezing.  Pt denies new neurological symptoms such as new headache, or facial or extremity weakness or numbness   Pt denies polydipsia, polyuria, for flu shot today Past Medical History:  Diagnosis Date  . ALLERGIC RHINITIS 05/05/2009  . DIABETES MELLITUS, TYPE II 03/24/2008  . HEPATITIS B 06/01/2007  . HYPERLIPIDEMIA 07/03/2007  . HYPERTENSION 06/04/2007  . Impaired glucose tolerance 11/05/2014  . RECTAL BLEEDING, HX OF 06/01/2007  . SPONDYLOSIS, CERVICAL 06/01/2007  . TOBACCO ABUSE 06/01/2007   No past surgical history on file.  reports that he has been smoking.  He does not have any smokeless tobacco history on file. He reports that he does not drink alcohol or use drugs. family history includes Alcohol abuse in his father; Cancer in his father; Diabetes in his mother; Heart disease in his mother; Stroke in his father and mother. No Known Allergies Current Outpatient Prescriptions on File Prior to Visit  Medication Sig Dispense Refill  . ALPRAZolam (XANAX) 0.25 MG tablet Take 1 tablet (0.25 mg total) by mouth 2 (two) times daily as needed for anxiety. 40 tablet 1  . cyclobenzaprine (FLEXERIL) 10 MG tablet Take 1 tablet (10 mg total) by mouth 3 (three) times daily as needed for muscle spasms. 30 tablet 0  . HYDROcodone-acetaminophen (NORCO/VICODIN) 5-325 MG tablet 1 tab by mouth per day as needed 30 tablet 0  . ketoconazole (NIZORAL) 2 % cream Apply 1 application  topically daily. 30 g 1  . metFORMIN (GLUCOPHAGE-XR) 500 MG 24 hr tablet Take 1 tablet (500 mg total) by mouth daily with breakfast. 90 tablet 3  . nystatin (MYCOSTATIN/NYSTOP) powder Use as directed twice per day as needed 30 g 11  . phentermine 37.5 MG capsule Take 1 capsule (37.5 mg total) by mouth every morning. 30 capsule 2  . rosuvastatin (CRESTOR) 20 MG tablet Take 1 tablet (20 mg total) by mouth daily. 90 tablet 3  . sildenafil (VIAGRA) 100 MG tablet Take 1 tablet (100 mg total) by mouth daily as needed for erectile dysfunction. 3 tablet 0  . valsartan (DIOVAN) 80 MG tablet Take 1 tablet (80 mg total) by mouth daily. 90 tablet 3  . vardenafil (LEVITRA) 20 MG tablet Take 1 tablet (20 mg total) by mouth daily as needed for erectile dysfunction. 1 by mouth every other day as needed 10 tablet 11  . varenicline (CHANTIX CONTINUING MONTH PAK) 1 MG tablet Take 1 tablet (1 mg total) by mouth 2 (two) times daily. 60 tablet 1  . varenicline (CHANTIX PAK) 0.5 MG X 11 & 1 MG X 42 tablet Take one 0.5 mg tablet by mouth once daily for 3 days, then increase to one 0.5 mg tablet twice daily for 4 days, then increase to one 1 mg tablet twice daily. 53 tablet 0   No current facility-administered medications on file prior to visit.    Review of Systems  Constitutional: Negative for unusual diaphoresis or night sweats HENT: Negative for ear swelling  or discharge Eyes: Negative for worsening visual haziness  Respiratory: Negative for choking and stridor.   Gastrointestinal: Negative for distension or worsening eructation Genitourinary: Negative for retention or change in urine volume.  Musculoskeletal: Negative for other MSK pain or swelling Skin: Negative for color change and worsening wound Neurological: Negative for tremors and numbness other than noted  Psychiatric/Behavioral: Negative for decreased concentration or agitation other than above   All other system neg per pt    Objective:   Physical  Exam BP 136/76   Pulse 90   Resp 20   Wt 275 lb (124.7 kg)   SpO2 97%   BMI 41.81 kg/m  VS noted, mild ill Constitutional: Pt appears in no apparent distress HENT: Head: NCAT.  Right Ear: External ear normal.  Left Ear: External ear normal.  Eyes: . Pupils are equal, round, and reactive to light. Conjunctivae with bilat erythema and cloudy d/c, and EOM are normal Bilat tm's with mild erythema.  Max sinus areas non tender.  Pharynx with mild erythema, no exudate Neck: Normal range of motion. Neck supple.  Cardiovascular: Normal rate and regular rhythm.   Pulmonary/Chest: Effort normal and breath sounds without rales or wheezing.  Neurological: Pt is alert. Not confused , motor grossly intact Skin: Skin is warm. No rash, no LE edema Psychiatric: Pt behavior is normal. No agitation.  No other new exam findings    Assessment & Plan:

## 2016-05-29 DIAGNOSIS — H109 Unspecified conjunctivitis: Secondary | ICD-10-CM | POA: Insufficient documentation

## 2016-05-29 NOTE — Assessment & Plan Note (Signed)
Mild to mod, for antibx course,  to f/u any worsening symptoms or concerns 

## 2016-05-29 NOTE — Assessment & Plan Note (Signed)
Mild to mod, for depomedrol IM 80,  to f/u any worsening symptoms or concerns 

## 2016-05-29 NOTE — Assessment & Plan Note (Signed)
stable overall by history and exam, recent data reviewed with pt, and pt to continue medical treatment as before,  to f/u any worsening symptoms or concerns BP Readings from Last 3 Encounters:  05/26/16 136/76  02/17/16 140/78  08/12/15 (!) 142/82

## 2016-07-04 ENCOUNTER — Telehealth: Payer: Self-pay | Admitting: *Deleted

## 2016-07-04 NOTE — Telephone Encounter (Signed)
Pt requesting refill on his Hydrocodone. Will hold until MD return tomorrow...Peter Schmidt

## 2016-07-05 MED ORDER — OSELTAMIVIR PHOSPHATE 75 MG PO CAPS: 75.0000 mg | ORAL_CAPSULE | Freq: Two times a day (BID) | ORAL | 0 refills | Status: DC

## 2016-07-05 MED ORDER — HYDROCODONE-ACETAMINOPHEN 5-325 MG PO TABS: ORAL_TABLET | ORAL | 0 refills | Status: DC

## 2016-07-05 NOTE — Telephone Encounter (Signed)
Done hardcopy to Corinne - pain medication  tamiflu done erx

## 2016-07-05 NOTE — Telephone Encounter (Signed)
Called pt no answer [facility] rx ready for pick-up.../lmb 

## 2016-07-05 NOTE — Telephone Encounter (Signed)
Pt call bck he states he has been expose to the flu requesting md to rx tamiflu...Peter Schmidt

## 2016-07-10 ENCOUNTER — Other Ambulatory Visit: Payer: Self-pay | Admitting: Internal Medicine

## 2016-07-11 NOTE — Telephone Encounter (Signed)
Routing to dr john, please advise, thanks 

## 2016-07-12 ENCOUNTER — Telehealth: Payer: Self-pay | Admitting: Geriatric Medicine

## 2016-07-12 NOTE — Telephone Encounter (Addendum)
PA has been initiated for chantix starting month box. Under pharmacist review. [id number]

## 2016-08-04 ENCOUNTER — Other Ambulatory Visit: Payer: Self-pay | Admitting: Internal Medicine

## 2016-08-26 ENCOUNTER — Telehealth: Payer: Self-pay | Admitting: *Deleted

## 2016-08-26 MED ORDER — HYDROCODONE-ACETAMINOPHEN 5-325 MG PO TABS: ORAL_TABLET | ORAL | 0 refills | Status: DC

## 2016-08-26 NOTE — Telephone Encounter (Signed)
Rec'd call pt requesting refills on his Hydrocodone...Peter Schmidt

## 2016-08-26 NOTE — Telephone Encounter (Signed)
Notified pt rx ready for pick-up.../lmb 

## 2016-08-26 NOTE — Telephone Encounter (Signed)
Done hardcopy to [name]  

## 2016-10-18 ENCOUNTER — Telehealth: Payer: Self-pay | Admitting: *Deleted

## 2016-10-18 DIAGNOSIS — G8929 Other chronic pain: Secondary | ICD-10-CM

## 2016-10-18 NOTE — Telephone Encounter (Signed)
Rec'd call pt requesting refill on his Hydrocodone.../lmb 

## 2016-10-18 NOTE — Telephone Encounter (Signed)
I no longer treat chronic pain with schedule II or higher narcotics, so I would feel uncomfortable with this refill.  I could consider tramadol if he likes, otherwise should consider seeing Sports Med in this office for persistent neck pain

## 2016-10-19 MED ORDER — HYDROCODONE-ACETAMINOPHEN 5-325 MG PO TABS: ORAL_TABLET | ORAL | 0 refills | Status: DC

## 2016-10-19 NOTE — Telephone Encounter (Addendum)
Stella, I overlooked that.  I've informed pt that script was ready for pick up.

## 2016-10-19 NOTE — Telephone Encounter (Signed)
Peter Schmidt you can not faxed Hydrocodone rx's that is a schedule II drug and he is going to have to pick-up rx and take to the pharmacy...Peter Schmidt

## 2016-10-19 NOTE — Telephone Encounter (Signed)
Ok for pain management referral - will do  rx -Done hardcopy to [employer]

## 2016-10-19 NOTE — Telephone Encounter (Signed)
Notified pt w/MD response pt states he can not take Tramadol because it upsets his stomach and which he inform MD. Pt is requesting to be referred to pain management instead, and can MD refill med this time until he get to see someone w/pain management...Peter Schmidt

## 2016-10-19 NOTE — Telephone Encounter (Signed)
Faxed

## 2016-10-31 ENCOUNTER — Other Ambulatory Visit: Payer: Self-pay | Admitting: Internal Medicine

## 2016-10-31 DIAGNOSIS — G8929 Other chronic pain: Secondary | ICD-10-CM

## 2017-01-23 ENCOUNTER — Telehealth: Payer: Self-pay | Admitting: Internal Medicine

## 2017-01-23 MED ORDER — LOSARTAN POTASSIUM 50 MG PO TABS: 50.0000 mg | ORAL_TABLET | Freq: Every day | ORAL | 3 refills | Status: DC

## 2017-01-23 NOTE — Telephone Encounter (Signed)
Pt called in about the recall on his valsartan He is wanting to know asap what med can be called in instead.  He stated he has been off his med for 3 days now

## 2017-01-23 NOTE — Telephone Encounter (Signed)
Ok for change diovan 80 to losartan 50 - done erx

## 2017-01-24 NOTE — Telephone Encounter (Signed)
Notified pt w/MD recommendation inform rx has been sent to [facility]...Peter Schmidt

## 2017-03-15 ENCOUNTER — Telehealth: Payer: Self-pay | Admitting: Internal Medicine

## 2017-03-15 DIAGNOSIS — Z23 Encounter for immunization: Secondary | ICD-10-CM | POA: Diagnosis not present

## 2017-03-15 MED ORDER — HYDROCODONE-ACETAMINOPHEN 5-325 MG PO TABS: ORAL_TABLET | ORAL | 0 refills | Status: DC

## 2017-03-15 NOTE — Telephone Encounter (Signed)
There was a referral placed for pain mgmt back in April and the referral was sent early May. I was going back through old stuff and called the pain office. They state the don't have any record of it.  I called pt to see if he is still needing this and he said yes.  He also noted that he would like a prescription for hydrocodone. He's hasn't been asking for it because of the pain mgmt referral and has been taking ibuprofen.  Since the referral has been taking so long, he would like another prescription. Please advise

## 2017-03-15 NOTE — Telephone Encounter (Signed)
Pt informed Script at front desk 

## 2017-03-15 NOTE — Telephone Encounter (Signed)
Ok, but this is last rx I can do, as I do not treat chronic pain with schedule II or higher narcotic in my practice, thanks  Done hardcopy to shirron

## 2017-09-05 ENCOUNTER — Telehealth: Payer: Self-pay | Admitting: Internal Medicine

## 2017-09-05 MED ORDER — ROSUVASTATIN CALCIUM 20 MG PO TABS: 20.0000 mg | ORAL_TABLET | Freq: Every day | ORAL | 0 refills | Status: DC

## 2017-09-05 MED ORDER — METFORMIN HCL ER 500 MG PO TB24: ORAL_TABLET | ORAL | 0 refills | Status: DC

## 2017-09-05 NOTE — Telephone Encounter (Signed)
Copied from [place] [phone]. Topic: Quick Communication - Rx Refill/Question >> Sep 05, 2017  8:48 AM Scherrie Gerlach wrote: Medication: metFORMIN (GLUCOPHAGE-XR) 500 MG 24 hr tablet                     rosuvastatin (CRESTOR) 20 MG tablet  Has the patient contacted their pharmacy? yes    But pt has not been seen since 02/17/2016. Pt hoping to get enough to to an appt 09/28/2017 [id number] - [place], [address] AT [address] [phone] (Phone) [phone] (Fax)

## 2017-09-05 NOTE — Telephone Encounter (Signed)
Refill  Request  Crestor  -  Last filled  08/04/2016   Metformin Last  Filled  08/04/2016    Last HGB AIC  08/13/2015    Last  OV  02/17/2016  Has  appt  Scheduled   4/42019     Pharmacy requested   [facility]   [address] / [place]

## 2017-09-05 NOTE — Telephone Encounter (Signed)
Per office policy sent 30 day to local pharmacy until appt.../lmb  

## 2017-09-28 ENCOUNTER — Encounter: Payer: Self-pay | Admitting: Internal Medicine

## 2017-09-28 ENCOUNTER — Ambulatory Visit (INDEPENDENT_AMBULATORY_CARE_PROVIDER_SITE_OTHER)
Admission: RE | Admit: 2017-09-28 | Discharge: 2017-09-28 | Disposition: A | Discharge status: Home / Self Care | Payer: 59 | Source: Ambulatory Visit | Attending: Internal Medicine | Admitting: Internal Medicine

## 2017-09-28 ENCOUNTER — Other Ambulatory Visit (INDEPENDENT_AMBULATORY_CARE_PROVIDER_SITE_OTHER): Payer: 59

## 2017-09-28 ENCOUNTER — Ambulatory Visit (INDEPENDENT_AMBULATORY_CARE_PROVIDER_SITE_OTHER): Payer: 59 | Admitting: Internal Medicine

## 2017-09-28 VITALS — BP 112/68 | HR 73 | Temp 98.3°F | Ht 68.0 in | Wt 218.0 lb

## 2017-09-28 DIAGNOSIS — Z23 Encounter for immunization: Secondary | ICD-10-CM | POA: Diagnosis not present

## 2017-09-28 DIAGNOSIS — R7302 Impaired glucose tolerance (oral): Secondary | ICD-10-CM

## 2017-09-28 DIAGNOSIS — Z0000 Encounter for general adult medical examination without abnormal findings: Secondary | ICD-10-CM | POA: Diagnosis not present

## 2017-09-28 DIAGNOSIS — Z0001 Encounter for general adult medical examination with abnormal findings: Secondary | ICD-10-CM

## 2017-09-28 DIAGNOSIS — F17200 Nicotine dependence, unspecified, uncomplicated: Secondary | ICD-10-CM

## 2017-09-28 DIAGNOSIS — R059 Cough, unspecified: Secondary | ICD-10-CM

## 2017-09-28 DIAGNOSIS — R05 Cough: Secondary | ICD-10-CM

## 2017-09-28 LAB — HEPATIC FUNCTION PANEL
ALBUMIN: 4.3 g/dL (ref 3.5–5.2)
ALT: 9 U/L (ref 0–53)
AST: 11 U/L (ref 0–37)
Alkaline Phosphatase: 58 U/L (ref 39–117)
Bilirubin, Direct: 0.1 mg/dL (ref 0.0–0.3)
TOTAL PROTEIN: 6.9 g/dL (ref 6.0–8.3)
Total Bilirubin: 0.6 mg/dL (ref 0.2–1.2)

## 2017-09-28 LAB — URINALYSIS, ROUTINE W REFLEX MICROSCOPIC
HGB URINE DIPSTICK: NEGATIVE
LEUKOCYTES UA: NEGATIVE
Nitrite: NEGATIVE
PH: 5.5 (ref 5.0–8.0)
TOTAL PROTEIN, URINE-UPE24: NEGATIVE
URINE GLUCOSE: NEGATIVE
UROBILINOGEN UA: 0.2 (ref 0.0–1.0)

## 2017-09-28 LAB — CBC WITH DIFFERENTIAL/PLATELET
BASOS PCT: 0.6 % (ref 0.0–3.0)
Basophils Absolute: 0 10*3/uL (ref 0.0–0.1)
EOS PCT: 2.8 % (ref 0.0–5.0)
Eosinophils Absolute: 0.2 10*3/uL (ref 0.0–0.7)
HEMATOCRIT: 38.3 % — AB (ref 39.0–52.0)
Hemoglobin: 12.8 g/dL — ABNORMAL LOW (ref 13.0–17.0)
LYMPHS ABS: 2.2 10*3/uL (ref 0.7–4.0)
LYMPHS PCT: 34.4 % (ref 12.0–46.0)
MCHC: 33.5 g/dL (ref 30.0–36.0)
MCV: 78.8 fl (ref 78.0–100.0)
MONOS PCT: 7.2 % (ref 3.0–12.0)
Monocytes Absolute: 0.5 10*3/uL (ref 0.1–1.0)
NEUTROS ABS: 3.5 10*3/uL (ref 1.4–7.7)
NEUTROS PCT: 55 % (ref 43.0–77.0)
PLATELETS: 238 10*3/uL (ref 150.0–400.0)
RBC: 4.87 Mil/uL (ref 4.22–5.81)
RDW: 15.2 % (ref 11.5–15.5)
WBC: 6.4 10*3/uL (ref 4.0–10.5)

## 2017-09-28 LAB — BASIC METABOLIC PANEL
BUN: 9 mg/dL (ref 6–23)
CHLORIDE: 103 meq/L (ref 96–112)
CO2: 29 meq/L (ref 19–32)
Calcium: 9.5 mg/dL (ref 8.4–10.5)
Creatinine, Ser: 0.66 mg/dL (ref 0.40–1.50)
GFR: 158.11 mL/min (ref >60.00)
Glucose, Bld: 82 mg/dL (ref 70–99)
Potassium: 4 mEq/L (ref 3.5–5.1)
SODIUM: 139 meq/L (ref 135–145)

## 2017-09-28 LAB — MICROALBUMIN / CREATININE URINE RATIO
CREATININE, U: 235.8 mg/dL
Microalb Creat Ratio: 0.7 mg/g (ref 0.0–30.0)
Microalb, Ur: 1.6 mg/dL (ref 0.0–1.9)

## 2017-09-28 LAB — LIPID PANEL
CHOL/HDL RATIO: 2
CHOLESTEROL: 143 mg/dL (ref 0–200)
HDL: 67 mg/dL (ref >39.00)
LDL CALC: 64 mg/dL (ref 0–99)
NonHDL: 75.51
TRIGLYCERIDES: 56 mg/dL (ref 0.0–149.0)
VLDL: 11.2 mg/dL (ref 0.0–40.0)

## 2017-09-28 LAB — HEMOGLOBIN A1C: Hgb A1c MFr Bld: 6.1 % (ref 4.6–6.5)

## 2017-09-28 LAB — PSA: PSA: 0.3 ng/mL (ref 0.10–4.00)

## 2017-09-28 LAB — TSH: TSH: 0.88 u[IU]/mL (ref 0.35–4.50)

## 2017-09-28 MED ORDER — VARENICLINE TARTRATE 1 MG PO TABS: 1.0000 mg | ORAL_TABLET | Freq: Two times a day (BID) | ORAL | 1 refills | Status: DC

## 2017-09-28 MED ORDER — LOSARTAN POTASSIUM 50 MG PO TABS: 50.0000 mg | ORAL_TABLET | Freq: Every day | ORAL | 3 refills | Status: DC

## 2017-09-28 MED ORDER — VARENICLINE TARTRATE 0.5 MG X 11 & 1 MG X 42 PO MISC: ORAL | 0 refills | Status: DC

## 2017-09-28 MED ORDER — ZOSTER VAC RECOMB ADJUVANTED 50 MCG/0.5ML IM SUSR: 0.5000 mL | Freq: Once | INTRAMUSCULAR | 1 refills | Status: AC

## 2017-09-28 MED ORDER — SILDENAFIL CITRATE 100 MG PO TABS: 100.0000 mg | ORAL_TABLET | Freq: Every day | ORAL | 0 refills | Status: DC | PRN

## 2017-09-28 MED ORDER — ROSUVASTATIN CALCIUM 20 MG PO TABS: 20.0000 mg | ORAL_TABLET | Freq: Every day | ORAL | 0 refills | Status: DC

## 2017-09-28 MED ORDER — METFORMIN HCL ER 500 MG PO TB24: ORAL_TABLET | ORAL | 0 refills | Status: DC

## 2017-09-28 NOTE — Progress Notes (Signed)
Subjective:    Patient ID: Peter Schmidt, male    DOB: 1956-01-26, 62 y.o.   MRN: [id number]  HPI  Here for wellness and f/u;  Overall doing ok;  Pt denies Chest pain, worsening SOB, DOE, wheezing, orthopnea, PND, worsening LE edema, palpitations, dizziness or syncope.  Pt denies neurological change such as new headache, facial or extremity weakness.  Pt denies polydipsia, polyuria, or low sugar symptoms. Pt states overall good compliance with treatment and medications, good tolerability, and has been trying to follow appropriate diet.  Pt denies worsening depressive symptoms, suicidal ideation or panic. No fever, night sweats, wt loss, loss of appetite, or other constitutional symptoms.  Pt states good ability with ADL's, has low fall risk, home safety reviewed and adequate, no other significant changes in hearing or vision, and only occasionally active with exercise.  Lost wt intentionally with lower carb better diet.  Plans to start going back to the gym soon.  Wt Readings from Last 3 Encounters:  09/28/17 218 lb (98.9 kg)  05/26/16 275 lb (124.7 kg)  02/17/16 270 lb (122.5 kg)  Mother died breast ca, father and 1 brother with prostate ca.  Asks for shingrix and chantix, tyruying to quit smoking  Declines HIV and hep c screen.  Does have unusual non prod cough without wheezing.  Due for podiatry referral  No other interval hx or new complaints Past Medical History:  Diagnosis Date  . ALLERGIC RHINITIS 05/05/2009  . DIABETES MELLITUS, TYPE II 03/24/2008  . HEPATITIS B 06/01/2007  . HYPERLIPIDEMIA 07/03/2007  . HYPERTENSION 06/04/2007  . Impaired glucose tolerance 11/05/2014  . RECTAL BLEEDING, HX OF 06/01/2007  . SPONDYLOSIS, CERVICAL 06/01/2007  . TOBACCO ABUSE 06/01/2007   No past surgical history on file.  reports that he has been smoking.  He has never used smokeless tobacco. He reports that he does not drink alcohol or use drugs. family history includes Alcohol abuse in his father; Cancer in his  father; Diabetes in his mother; Heart disease in his mother; Stroke in his father and mother. No Known Allergies Current Outpatient Medications on File Prior to Visit  Medication Sig Dispense Refill  . azelastine (OPTIVAR) 0.05 % ophthalmic solution Place 1 drop into both eyes 2 (two) times daily as needed. 6 mL 12  . vardenafil (LEVITRA) 20 MG tablet Take 1 tablet (20 mg total) by mouth daily as needed for erectile dysfunction. 1 by mouth every other day as needed 10 tablet 11   No current facility-administered medications on file prior to visit.    Review of Systems Constitutional: Negative for other unusual diaphoresis, sweats, appetite or weight changes HENT: Negative for other worsening hearing loss, ear pain, facial swelling, mouth sores or neck stiffness.   Eyes: Negative for other worsening pain, redness or other visual disturbance.  Respiratory: Negative for other stridor or swelling Cardiovascular: Negative for other palpitations or other chest pain  Gastrointestinal: Negative for worsening diarrhea or loose stools, blood in stool, distention or other pain Genitourinary: Negative for hematuria, flank pain or other change in urine volume.  Musculoskeletal: Negative for myalgias or other joint swelling.  Skin: Negative for other color change, or other wound or worsening drainage.  Neurological: Negative for other syncope or numbness. Hematological: Negative for other adenopathy or swelling Psychiatric/Behavioral: Negative for hallucinations, other worsening agitation, SI, self-injury, or new decreased concentration All other system neg per pt    Objective:   Physical Exam BP 112/68   Pulse 73  Temp 98.3 F (36.8 C) (Oral)   Ht 5\' 8"  (1.727 m)   Wt 218 lb (98.9 kg)   SpO2 99%   BMI 33.15 kg/m  VS noted,  Constitutional: Pt is oriented to person, place, and time. Appears well-developed and well-nourished, in no significant distress and comfortable Head: Normocephalic and  atraumatic  Eyes: Conjunctivae and EOM are normal. Pupils are equal, round, and reactive to light Right Ear: External ear normal without discharge Left Ear: External ear normal without discharge Nose: Nose without discharge or deformity Mouth/Throat: Oropharynx is without other ulcerations and moist  Neck: Normal range of motion. Neck supple. No JVD present. No tracheal deviation present or significant neck LA or mass Cardiovascular: Normal rate, regular rhythm, normal heart sounds and intact distal pulses.   Pulmonary/Chest: WOB normal and breath sounds without rales or wheezing  Abdominal: Soft. Bowel sounds are normal. NT. No HSM  Musculoskeletal: Normal range of motion. Exhibits no edema Lymphadenopathy: Has no other cervical adenopathy.  Neurological: Pt is alert and oriented to person, place, and time. Pt has normal reflexes. No cranial nerve deficit. Motor grossly intact, Gait intact Skin: Skin is warm and dry. No rash noted or new ulcerations Psychiatric:  Has normal mood and affect. Behavior is normal without agitation No other exam findings    Assessment & Plan:

## 2017-09-28 NOTE — Patient Instructions (Addendum)
Your shingles shot was sent to the pharmacy  Please take all new medication as prescribed - the chantix  You had the Prevnar 13 pneumonia shot today  You will be contacted regarding the referral for: colonoscopy  Please continue all other medications as before, and refills have been done if requested.  Please have the pharmacy call with any other refills you may need.  Please continue your efforts at being more active, low cholesterol diet, and weight control.  You are otherwise up to date with prevention measures today.  Please keep your appointments with your specialists as you may have planned  Please go to the XRAY Department in the Basement (go straight as you get off the elevator) for the x-ray testing  Please go to the LAB in the Basement (turn left off the elevator) for the tests to be done today  You will be contacted by phone if any changes need to be made immediately.  Otherwise, you will receive a letter about your results with an explanation, but please check with MyChart first.  Please remember to sign up for MyChart if you have not done so, as this will be important to you in the future with finding out test results, communicating by private email, and scheduling acute appointments online when needed.  Please return in 1 year for your yearly visit, or sooner if needed, with Lab testing done 3-5 days before

## 2017-09-29 ENCOUNTER — Encounter: Payer: Self-pay | Admitting: Internal Medicine

## 2017-09-30 DIAGNOSIS — R05 Cough: Secondary | ICD-10-CM | POA: Insufficient documentation

## 2017-09-30 DIAGNOSIS — R059 Cough, unspecified: Secondary | ICD-10-CM | POA: Insufficient documentation

## 2017-09-30 DIAGNOSIS — Z23 Encounter for immunization: Secondary | ICD-10-CM | POA: Diagnosis not present

## 2017-09-30 NOTE — Assessment & Plan Note (Signed)
Ok for chantix  

## 2017-09-30 NOTE — Assessment & Plan Note (Signed)
stable overall by history and exam, recent data reviewed with pt, and pt to continue medical treatment as before,  to f/u any worsening symptoms or concerns ' Lab Results  Component Value Date   HGBA1C 6.1 09/28/2017  for podiatry referral per pt request

## 2017-09-30 NOTE — Assessment & Plan Note (Signed)

## 2017-09-30 NOTE — Assessment & Plan Note (Signed)
Exam benign, to quit smoking, for cxr

## 2017-10-05 ENCOUNTER — Encounter: Payer: Self-pay | Admitting: Gastroenterology

## 2017-10-06 ENCOUNTER — Other Ambulatory Visit: Payer: Self-pay | Admitting: Internal Medicine

## 2017-10-09 ENCOUNTER — Telehealth: Payer: Self-pay

## 2017-10-09 NOTE — Telephone Encounter (Signed)
PA denial  Determination sent to scan

## 2017-10-25 ENCOUNTER — Other Ambulatory Visit: Payer: Self-pay | Admitting: Internal Medicine

## 2017-10-27 ENCOUNTER — Ambulatory Visit (AMBULATORY_SURGERY_CENTER): Payer: Self-pay | Admitting: *Deleted

## 2017-10-27 ENCOUNTER — Other Ambulatory Visit: Payer: Self-pay

## 2017-10-27 VITALS — Ht 68.0 in | Wt 214.0 lb

## 2017-10-27 DIAGNOSIS — Z1211 Encounter for screening for malignant neoplasm of colon: Secondary | ICD-10-CM

## 2017-10-27 MED ORDER — NA SULFATE-K SULFATE-MG SULF 17.5-3.13-1.6 GM/177ML PO SOLN: ORAL | 0 refills | Status: AC

## 2017-10-27 NOTE — Progress Notes (Signed)
Patient denies any allergies to eggs or soy. Patient denies any problems with anesthesia/sedation. Patient denies any oxygen use at home. Patient denies taking any diet/weight loss medications or blood thinners. EMMI education assisgned to patient on colonoscopy, this was explained and instructions given to patient. 

## 2017-11-13 ENCOUNTER — Encounter: Payer: 59 | Admitting: Gastroenterology

## 2017-12-09 DIAGNOSIS — Z23 Encounter for immunization: Secondary | ICD-10-CM | POA: Diagnosis not present

## 2018-09-13 ENCOUNTER — Ambulatory Visit: Payer: 59 | Admitting: Internal Medicine

## 2018-09-13 ENCOUNTER — Telehealth: Payer: Self-pay | Admitting: *Deleted

## 2018-09-13 NOTE — Telephone Encounter (Signed)
Called to screen patient for covid-19 before his appointment today with PCP. Patient states he feels he may have sinus issues. He c/o runny nose, some cough but not coughing up sputum. He has headache when he wakes up in the morning. He does state he has night sweats and some joint pain. These symptoms has been going on for about a week. Based upon patient's symptoms of no fever or chills, no SOB, mild cough with no sputum, and no nausea, vomiting or diarrhea; it was felt that patient was at low risk for covid-19 and would be fine to come to visit today. However, patient decided after the screening call he would feel more comfortable due to the circumstance to cancel the visit today. Patient stated he would call to make an appointment if his symptoms got worst but will stay home for now.

## 2018-09-18 ENCOUNTER — Ambulatory Visit: Payer: Self-pay

## 2018-09-18 ENCOUNTER — Ambulatory Visit (INDEPENDENT_AMBULATORY_CARE_PROVIDER_SITE_OTHER): Payer: 59 | Admitting: Internal Medicine

## 2018-09-18 ENCOUNTER — Other Ambulatory Visit: Payer: Self-pay

## 2018-09-18 ENCOUNTER — Ambulatory Visit (INDEPENDENT_AMBULATORY_CARE_PROVIDER_SITE_OTHER): Payer: 59 | Admitting: Family Medicine

## 2018-09-18 VITALS — BP 142/82 | HR 81 | Temp 98.3°F | Ht 68.0 in | Wt 184.0 lb

## 2018-09-18 VITALS — Ht 68.0 in

## 2018-09-18 DIAGNOSIS — F411 Generalized anxiety disorder: Secondary | ICD-10-CM

## 2018-09-18 DIAGNOSIS — M75121 Complete rotator cuff tear or rupture of right shoulder, not specified as traumatic: Secondary | ICD-10-CM | POA: Diagnosis not present

## 2018-09-18 DIAGNOSIS — M25511 Pain in right shoulder: Secondary | ICD-10-CM

## 2018-09-18 DIAGNOSIS — R7302 Impaired glucose tolerance (oral): Secondary | ICD-10-CM | POA: Diagnosis not present

## 2018-09-18 DIAGNOSIS — M75101 Unspecified rotator cuff tear or rupture of right shoulder, not specified as traumatic: Secondary | ICD-10-CM | POA: Insufficient documentation

## 2018-09-18 DIAGNOSIS — I10 Essential (primary) hypertension: Secondary | ICD-10-CM

## 2018-09-18 MED ORDER — NITROGLYCERIN 0.2 MG/HR TD PT24: MEDICATED_PATCH | TRANSDERMAL | 1 refills | Status: DC

## 2018-09-18 MED ORDER — HYDROCODONE-ACETAMINOPHEN 5-325 MG PO TABS: 1.0000 | ORAL_TABLET | Freq: Four times a day (QID) | ORAL | 0 refills | Status: DC | PRN

## 2018-09-18 NOTE — Patient Instructions (Addendum)
Good to see you  Rotator cuff tear Ice 20 minutes 2 times daily. Usually after activity and before bed. pennsaid pinkie amount topically 2 times daily as needed.  Exercises 3 times a week.  Keep hands within peripheral vison  Nitroglycerin Protocol   Apply 1/4 nitroglycerin patch to affected area daily.  Change position of patch within the affected area every 24 hours.  You may experience a headache during the first 1-2 weeks of using the patch, these should subside.  If you experience headaches after beginning nitroglycerin patch treatment, you may take your preferred over the counter pain reliever.  Another side effect of the nitroglycerin patch is skin irritation or rash related to patch adhesive.  Please notify our office if you develop more severe headaches or rash, and stop the patch.  Tendon healing with nitroglycerin patch may require 12 to 24 weeks depending on the extent of injury.  Men should not use if taking Viagra, Cialis, or Levitra.  DON NOT TAKE TOGETHER OR COULD DIE!!!!  Do not use if you have migraines or rosacea.  See me again in 4 weeks

## 2018-09-18 NOTE — Progress Notes (Signed)
Subjective:    Patient ID: Peter Schmidt, male    DOB: 1956/03/27, 63 y.o.   MRN: [id number]  HPI  Here with recent ? URI symptoms now improved; state He c/o runny nose, some cough but not coughing up sputum. He has headache when he wakes up in the morning. He does state he has night sweats and some joint pain. These symptoms has been going on for about a week. Based upon patient's symptoms of no fever or chills, no SOB, mild cough with no sputum, and no nausea, vomiting or diarrhea; it was felt that patient was at low risk for covid-19.  Pt today without fever, sinus congestion or ST, cough or Sob.  Pt denies chest pain, increased sob or doe, wheezing, orthopnea, PND, increased LE swelling, palpitations, dizziness or syncope.  Pt denies new neurological symptoms such as new headache, or facial or extremity weakness or numbness   Pt denies polydipsia, polyuria  But does c/o severe right shoulder pain now persistent severe 4 days after doing jumping jacks for a religious event.  Now simply unable to raise the arm at the shoulder from the waist level.  Has some swelling as well.  Not wanting to consider MRI or ortho for now. Past Medical History:  Diagnosis Date  . ALLERGIC RHINITIS 05/05/2009  . DIABETES MELLITUS, TYPE II 03/24/2008  . HEPATITIS B 06/01/2007  . HYPERLIPIDEMIA 07/03/2007  . HYPERTENSION 06/04/2007  . Impaired glucose tolerance 11/05/2014  . RECTAL BLEEDING, HX OF 06/01/2007  . SPONDYLOSIS, CERVICAL 06/01/2007  . TOBACCO ABUSE 06/01/2007   Past Surgical History:  Procedure Laterality Date  . WISDOM TOOTH EXTRACTION      reports that he has been smoking cigarettes. He has been smoking about 0.25 packs per day. He has never used smokeless tobacco. He reports that he does not drink alcohol or use drugs. family history includes Alcohol abuse in his father; Breast cancer in his mother; Cancer in his father; Diabetes in his mother; Heart disease in his mother; Prostate cancer in his father;  Stroke in his father and mother. No Known Allergies Current Outpatient Medications on File Prior to Visit  Medication Sig Dispense Refill  . losartan (COZAAR) 50 MG tablet Take 1 tablet (50 mg total) by mouth daily. 90 tablet 3  . metFORMIN (GLUCOPHAGE-XR) 500 MG 24 hr tablet TAKE 1 TABLET BY MOUTH ONCE DAILY WITH BREAKFAST 30 tablet 11  . Na Sulfate-K Sulfate-Mg Sulf 17.5-3.13-1.6 GM/177ML SOLN Suprep (no substitutions)-TAKE AS DIRECTED. 354 mL 0  . Omega-3 Fatty Acids (FISH OIL PO) Take 1 tablet by mouth daily.    . rosuvastatin (CRESTOR) 20 MG tablet TAKE 1 TABLET BY MOUTH DAILY. 90 tablet 3  . vardenafil (LEVITRA) 20 MG tablet Take 1 tablet (20 mg total) by mouth daily as needed for erectile dysfunction. 1 by mouth every other day as needed 10 tablet 11   No current facility-administered medications on file prior to visit.    Review of Systems  Constitutional: Negative for other unusual diaphoresis or sweats HENT: Negative for ear discharge or swelling Eyes: Negative for other worsening visual disturbances Respiratory: Negative for stridor or other swelling  Gastrointestinal: Negative for worsening distension or other blood Genitourinary: Negative for retention or other urinary change Musculoskeletal: Negative for other MSK pain or swelling Skin: Negative for color change or other new lesions Neurological: Negative for worsening tremors and other numbness  Psychiatric/Behavioral: Negative for worsening agitation or other fatigue No other exam findings  Objective:   Physical Exam BP (!) 142/82   Pulse 81   Temp 98.3 F (36.8 C) (Oral)   Ht 5\' 8"  (1.727 m)   Wt 184 lb (83.5 kg)   SpO2 98%   BMI 27.98 kg/m  VS noted, not ill appearing Constitutional: Pt appears in NAD HENT: Head: NCAT.  Right Ear: External ear normal.  Left Ear: External ear normal.  Eyes: . Pupils are equal, round, and reactive to light. Conjunctivae and EOM are normal Bilat tm's with mild erythema.   Max sinus areas non tender.  Pharynx with mild erythema, no exudate Nose: without d/c or deformity Neck: Neck supple. Gross normal ROM Cardiovascular: Normal rate and regular rhythm.   Pulmonary/Chest: Effort normal and breath sounds without rales or wheezing.  Right shoulder with 1-2+ diffuse swelling worse anteriorly with tenderness and severe pain on any attempt to abduct or forward elevated the arm from waist level Neurological: Pt is alert. At baseline orientation, motor grossly intact Skin: Skin is warm. No rashes, other new lesions, no LE edema Psychiatric: Pt behavior is normal without agitation  No other exam findings Lab Results  Component Value Date   WBC 6.4 09/28/2017   HGB 12.8 (L) 09/28/2017   HCT 38.3 (L) 09/28/2017   PLT 238.0 09/28/2017   GLUCOSE 82 09/28/2017   CHOL 143 09/28/2017   TRIG 56.0 09/28/2017   HDL 67.00 09/28/2017   LDLDIRECT 145.3 07/21/2008   LDLCALC 64 09/28/2017   ALT 9 09/28/2017   AST 11 09/28/2017   NA 139 09/28/2017   K 4.0 09/28/2017   CL 103 09/28/2017   CREATININE 0.66 09/28/2017   BUN 9 09/28/2017   CO2 29 09/28/2017   TSH 0.88 09/28/2017   PSA 0.30 09/28/2017   HGBA1C 6.1 09/28/2017   MICROALBUR 1.6 09/28/2017          Assessment & Plan:

## 2018-09-18 NOTE — Assessment & Plan Note (Signed)
stable overall by history and exam, recent data reviewed with pt, and pt to continue medical treatment as before,  to f/u any worsening symptoms or concerns  

## 2018-09-18 NOTE — Assessment & Plan Note (Signed)
Severe, suspect possible surgical issue such as torn tendon, but pt not wanting mri or ortho at this time, will tx with hydrocodone prn, and refer to sports medicine today for further consideration

## 2018-09-18 NOTE — Assessment & Plan Note (Signed)
Patient was doing jumping jacks, does appear to have a right rotator cuff tear, he does have retraction of 1.7 cm.  Patient was to try conservative therapy with home exercise, topical anti-inflammatories and nitroglycerin.  Patient has been prescribed Levitra in the past but is not taking it.  We warned about the potential interaction including the cardiovascular risk factors.  Discussed with patient about home exercises.  Hold on physical therapy due to all physical therapy is been closed recently.  Follow-up again for the next visit

## 2018-09-18 NOTE — Progress Notes (Signed)
[facility] [place], [place] [address] Phone: [phone] Subjective:   [name] am serving as a Education administrator for Dr. Hulan Saas.  I'm seeing this patient by the request  of:  Biagio Borg, MD   CC: Right shoulder pain  GEZ:[id number]  Peter Schmidt is a 64 y.o. male coming in with complaint of right shoulder pain. Remembers doing jumping jacks and sleeping on his shoulder which may have caused pain.  Patient states the pain seems to be worsening during this last week  Onset- 1 week Location- Anterior Duration-  Character-dull, throbbing aching pain Aggravating factors- flexion, extension Reliving factors-not moving Therapies tried-ice intermittently ibuprofen Severity-8 out of 10     Past Medical History:  Diagnosis Date  . ALLERGIC RHINITIS 05/05/2009  . DIABETES MELLITUS, TYPE II 03/24/2008  . HEPATITIS B 06/01/2007  . HYPERLIPIDEMIA 07/03/2007  . HYPERTENSION 06/04/2007  . Impaired glucose tolerance 11/05/2014  . RECTAL BLEEDING, HX OF 06/01/2007  . SPONDYLOSIS, CERVICAL 06/01/2007  . TOBACCO ABUSE 06/01/2007   Past Surgical History:  Procedure Laterality Date  . WISDOM TOOTH EXTRACTION     Social History   Socioeconomic History  . Marital status: Single    Spouse name: Not on file  . Number of children: Not on file  . Years of education: Not on file  . Highest education level: Not on file  Occupational History  . Occupation: alcohol and drug/therapist/psychologist  Social Needs  . Financial resource strain: Not on file  . Food insecurity:    Worry: Not on file    Inability: Not on file  . Transportation needs:    Medical: Not on file    Non-medical: Not on file  Tobacco Use  . Smoking status: Current Every Day Smoker    Packs/day: 0.25    Types: Cigarettes  . Smokeless tobacco: Never Used  Substance and Sexual Activity  . Alcohol use: No  . Drug use: No  . Sexual activity: Not on file  Lifestyle  . Physical  activity:    Days per week: Not on file    Minutes per session: Not on file  . Stress: Not on file  Relationships  . Social connections:    Talks on phone: Not on file    Gets together: Not on file    Attends religious service: Not on file    Active member of club or organization: Not on file    Attends meetings of clubs or organizations: Not on file    Relationship status: Not on file  Other Topics Concern  . Not on file  Social History Narrative   Single, homosexual   No Known Allergies Family History  Problem Relation Age of Onset  . Heart disease Mother   . Stroke Mother   . Diabetes Mother   . Breast cancer Mother   . Cancer Father        prostate  . Alcohol abuse Father        ETOH  . Stroke Father   . Prostate cancer Father   . Colon cancer Neg Hx   . Esophageal cancer Neg Hx   . Stomach cancer Neg Hx     Current Outpatient Medications (Endocrine & Metabolic):  .  metFORMIN (GLUCOPHAGE-XR) 500 MG 24 hr tablet, TAKE 1 TABLET BY MOUTH ONCE DAILY WITH BREAKFAST  Current Outpatient Medications (Cardiovascular):  .  losartan (COZAAR) 50 MG tablet, Take 1 tablet (50 mg total) by  mouth daily. .  nitroGLYCERIN (NITRODUR - DOSED IN MG/24 HR) 0.2 mg/hr patch, 1/4 patch daily .  rosuvastatin (CRESTOR) 20 MG tablet, TAKE 1 TABLET BY MOUTH DAILY. .  vardenafil (LEVITRA) 20 MG tablet, Take 1 tablet (20 mg total) by mouth daily as needed for erectile dysfunction. 1 by mouth every other day as needed   Current Outpatient Medications (Analgesics):  .  HYDROcodone-acetaminophen (NORCO/VICODIN) 5-325 MG tablet, Take 1 tablet by mouth every 6 (six) hours as needed.   Current Outpatient Medications (Other):  Marland Kitchen  Na Sulfate-K Sulfate-Mg Sulf 17.5-3.13-1.6 GM/177ML SOLN, Suprep (no substitutions)-TAKE AS DIRECTED. .  Omega-3 Fatty Acids (FISH OIL PO), Take 1 tablet by mouth daily.    Past medical history, social, surgical and family history all reviewed in electronic medical  record.  No pertanent information unless stated regarding to the chief complaint.   Review of Systems:  No headache, visual changes, nausea, vomiting, diarrhea, constipation, dizziness, abdominal pain, skin rash, fevers, chills, night sweats, weight loss, swollen lymph nodes, body aches, joint swelling,  chest pain, shortness of breath, mood changes.  Positive muscle aches  Objective  Height 5\' 8"  (1.727 m).   General: No apparent distress alert and oriented x3 mood and affect normal, dressed appropriately.  HEENT: Pupils equal, extraocular movements intact  Respiratory: Patient's speak in full sentences and does not appear short of breath  Cardiovascular: No lower extremity edema, non tender, no erythema  Skin: Warm dry intact with no signs of infection or rash on extremities or on axial skeleton.  Abdomen: Soft nontender  Neuro: Cranial nerves II through XII are intact, neurovascularly intact in all extremities with 2+ DTRs and 2+ pulses.  Lymph: No lymphadenopathy of posterior or anterior cervical chain or axillae bilaterally.  Gait normal with good balance and coordination.  MSK:  Non tender with full range of motion and good stability and symmetric strength and tone of  elbows, wrist, hip, knee and ankles bilaterally.  Right shoulder exam shows mild high riding humerus.  Patient does have a significant weakness with less than 3 out of 5 strength of the rotator cuff.  Severe weakness of the supraspinatus.  Good grip strength.  Neurovascular intact distally.  Passively near full range of motion.  MSK US performed of: Right shoulder This study was ordered, performed, and interpreted by Charlann Boxer D.O.  Shoulder:   Supraspinatus: Large near full-thickness tear noted retraction of 1.8 cm severity. Subscapularis:  Appears normal on long and transverse views. AC joint:  Capsule undistended, no geyser sign. Glenohumeral Joint:  Appears normal without effusion. Glenoid Labrum: Possible tear  anterior Biceps Tendon: Severe hypoechoic changes noted.  No true tear appreciated but suggested the anterior labrum could have a potential tear Impression: Supraspinatus tear 97110; 15 additional minutes spent for Therapeutic exercises as stated in above notes.  This included exercises focusing on stretching, strengthening, with significant focus on eccentric aspects.   Long term goals include an improvement in range of motion, strength, endurance as well as avoiding reinjury. Patient's frequency would include in 1-2 times a day, 3-5 times a week for a duration of 6-12 weeks. Shoulder Exercises that included:  Basic scapular stabilization to include adduction and depression of scapula Scaption, focusing on proper movement and good control Internal and External rotation utilizing a theraband, with elbow tucked at side entire time Rows with theraband   Proper technique shown and discussed handout in great detail with ATC.  All questions were discussed and answered.  Impression and Recommendations:     This case required medical decision making of moderate complexity. The above documentation has been reviewed and is accurate and complete Lyndal Pulley, DO       Note: This dictation was prepared with Dragon dictation along with smaller phrase technology. Any transcriptional errors that result from this process are unintentional.

## 2018-09-18 NOTE — Patient Instructions (Signed)
Please take all new medication as prescribed - the hydrocodone as needed for pain  Please see Sports Medicine today for further evaluation  Please continue all other medications as before, and refills have been done if requested.  Please have the pharmacy call with any other refills you may need.  Please continue your efforts at being more active, low cholesterol diet, and weight control.  Please keep your appointments with your specialists as you may have planned

## 2018-10-02 ENCOUNTER — Other Ambulatory Visit: Payer: Self-pay

## 2018-10-02 ENCOUNTER — Telehealth: Payer: Self-pay

## 2018-10-02 MED ORDER — HYDROCODONE-ACETAMINOPHEN 5-325 MG PO TABS: 1.0000 | ORAL_TABLET | Freq: Four times a day (QID) | ORAL | 0 refills | Status: DC | PRN

## 2018-10-02 MED ORDER — LOSARTAN POTASSIUM 50 MG PO TABS: 50.0000 mg | ORAL_TABLET | Freq: Every day | ORAL | 0 refills | Status: DC

## 2018-10-02 MED ORDER — ROSUVASTATIN CALCIUM 20 MG PO TABS: 20.0000 mg | ORAL_TABLET | Freq: Every day | ORAL | 0 refills | Status: DC

## 2018-10-02 MED ORDER — METFORMIN HCL ER 500 MG PO TB24: ORAL_TABLET | ORAL | 0 refills | Status: DC

## 2018-10-02 NOTE — Telephone Encounter (Signed)
Called patient and educated him on how he can get his imaging from Dr. Tamala Julian through medical records

## 2018-10-02 NOTE — Telephone Encounter (Signed)
Pt has chose to reschedule his CPE for a later date in May.   I have sent in maintenance meds until then. However, he needs his pain med sent in also which I have pended in this encounter.

## 2018-10-02 NOTE — Telephone Encounter (Signed)
Done erx 

## 2018-10-03 ENCOUNTER — Encounter: Payer: 59 | Admitting: Internal Medicine

## 2018-10-16 ENCOUNTER — Ambulatory Visit: Payer: 59 | Admitting: Family Medicine

## 2018-10-31 ENCOUNTER — Other Ambulatory Visit: Payer: Self-pay | Admitting: Internal Medicine

## 2018-11-22 ENCOUNTER — Other Ambulatory Visit: Payer: Self-pay

## 2018-11-22 ENCOUNTER — Encounter: Payer: Self-pay | Admitting: Internal Medicine

## 2018-11-22 ENCOUNTER — Other Ambulatory Visit (INDEPENDENT_AMBULATORY_CARE_PROVIDER_SITE_OTHER): Payer: 59

## 2018-11-22 ENCOUNTER — Other Ambulatory Visit: Payer: Self-pay | Admitting: Internal Medicine

## 2018-11-22 ENCOUNTER — Ambulatory Visit (INDEPENDENT_AMBULATORY_CARE_PROVIDER_SITE_OTHER): Payer: 59 | Admitting: Internal Medicine

## 2018-11-22 VITALS — BP 120/76 | HR 79 | Temp 99.0°F | Ht 68.0 in | Wt 182.0 lb

## 2018-11-22 DIAGNOSIS — E785 Hyperlipidemia, unspecified: Secondary | ICD-10-CM

## 2018-11-22 DIAGNOSIS — I10 Essential (primary) hypertension: Secondary | ICD-10-CM

## 2018-11-22 DIAGNOSIS — Z114 Encounter for screening for human immunodeficiency virus [HIV]: Secondary | ICD-10-CM

## 2018-11-22 DIAGNOSIS — Z23 Encounter for immunization: Secondary | ICD-10-CM

## 2018-11-22 DIAGNOSIS — Z0001 Encounter for general adult medical examination with abnormal findings: Secondary | ICD-10-CM | POA: Diagnosis not present

## 2018-11-22 DIAGNOSIS — E611 Iron deficiency: Secondary | ICD-10-CM

## 2018-11-22 DIAGNOSIS — Z1159 Encounter for screening for other viral diseases: Secondary | ICD-10-CM

## 2018-11-22 DIAGNOSIS — E538 Deficiency of other specified B group vitamins: Secondary | ICD-10-CM | POA: Diagnosis not present

## 2018-11-22 DIAGNOSIS — E559 Vitamin D deficiency, unspecified: Secondary | ICD-10-CM

## 2018-11-22 DIAGNOSIS — J069 Acute upper respiratory infection, unspecified: Secondary | ICD-10-CM | POA: Diagnosis not present

## 2018-11-22 DIAGNOSIS — R7302 Impaired glucose tolerance (oral): Secondary | ICD-10-CM

## 2018-11-22 LAB — URINALYSIS, ROUTINE W REFLEX MICROSCOPIC
Bilirubin Urine: NEGATIVE
Hgb urine dipstick: NEGATIVE
Ketones, ur: NEGATIVE
Leukocytes,Ua: NEGATIVE
Nitrite: NEGATIVE
RBC / HPF: NONE SEEN (ref 0–?)
Specific Gravity, Urine: 1.015 (ref 1.000–1.030)
Total Protein, Urine: NEGATIVE
Urine Glucose: NEGATIVE
Urobilinogen, UA: 0.2 (ref 0.0–1.0)
pH: 6 (ref 5.0–8.0)

## 2018-11-22 LAB — CBC WITH DIFFERENTIAL/PLATELET
Basophils Absolute: 0 10*3/uL (ref 0.0–0.1)
Basophils Relative: 0.8 % (ref 0.0–3.0)
Eosinophils Absolute: 0.2 10*3/uL (ref 0.0–0.7)
Eosinophils Relative: 3 % (ref 0.0–5.0)
HCT: 37 % — ABNORMAL LOW (ref 39.0–52.0)
Hemoglobin: 12.6 g/dL — ABNORMAL LOW (ref 13.0–17.0)
Lymphocytes Relative: 35.9 % (ref 12.0–46.0)
Lymphs Abs: 2.1 10*3/uL (ref 0.7–4.0)
MCHC: 34 g/dL (ref 30.0–36.0)
MCV: 80.1 fl (ref 78.0–100.0)
Monocytes Absolute: 0.5 10*3/uL (ref 0.1–1.0)
Monocytes Relative: 8.4 % (ref 3.0–12.0)
Neutro Abs: 3 10*3/uL (ref 1.4–7.7)
Neutrophils Relative %: 51.9 % (ref 43.0–77.0)
Platelets: 195 10*3/uL (ref 150.0–400.0)
RBC: 4.62 Mil/uL (ref 4.22–5.81)
RDW: 14.9 % (ref 11.5–15.5)
WBC: 5.8 10*3/uL (ref 4.0–10.5)

## 2018-11-22 LAB — VITAMIN D 25 HYDROXY (VIT D DEFICIENCY, FRACTURES): VITD: 17.82 ng/mL — ABNORMAL LOW (ref 30.00–100.00)

## 2018-11-22 LAB — BASIC METABOLIC PANEL
BUN: 5 mg/dL — ABNORMAL LOW (ref 6–23)
CO2: 31 mEq/L (ref 19–32)
Calcium: 9.6 mg/dL (ref 8.4–10.5)
Chloride: 102 mEq/L (ref 96–112)
Creatinine, Ser: 0.69 mg/dL (ref 0.40–1.50)
GFR: 140.78 mL/min (ref >60.00)
Glucose, Bld: 61 mg/dL — ABNORMAL LOW (ref 70–99)
Potassium: 3.8 mEq/L (ref 3.5–5.1)
Sodium: 139 mEq/L (ref 135–145)

## 2018-11-22 LAB — LIPID PANEL
Cholesterol: 146 mg/dL (ref 0–200)
HDL: 74.8 mg/dL (ref >39.00)
LDL Cholesterol: 61 mg/dL (ref 0–99)
NonHDL: 71.61
Total CHOL/HDL Ratio: 2
Triglycerides: 52 mg/dL (ref 0.0–149.0)
VLDL: 10.4 mg/dL (ref 0.0–40.0)

## 2018-11-22 LAB — IBC PANEL
Iron: 78 ug/dL (ref 42–165)
Saturation Ratios: 27.3 % (ref 20.0–50.0)
Transferrin: 204 mg/dL — ABNORMAL LOW (ref 212.0–360.0)

## 2018-11-22 LAB — MICROALBUMIN / CREATININE URINE RATIO
Creatinine,U: 79.2 mg/dL
Microalb Creat Ratio: 0.9 mg/g (ref 0.0–30.0)
Microalb, Ur: <0.7 mg/dL (ref 0.0–1.9)

## 2018-11-22 LAB — HEPATIC FUNCTION PANEL
ALT: 16 U/L (ref 0–53)
AST: 15 U/L (ref 0–37)
Albumin: 4.6 g/dL (ref 3.5–5.2)
Alkaline Phosphatase: 61 U/L (ref 39–117)
Bilirubin, Direct: 0.1 mg/dL (ref 0.0–0.3)
Total Bilirubin: 0.5 mg/dL (ref 0.2–1.2)
Total Protein: 7.1 g/dL (ref 6.0–8.3)

## 2018-11-22 LAB — VITAMIN B12: Vitamin B-12: 247 pg/mL (ref 211–911)

## 2018-11-22 LAB — HEMOGLOBIN A1C: Hgb A1c MFr Bld: 6.2 % (ref 4.6–6.5)

## 2018-11-22 LAB — PSA: PSA: 0.41 ng/mL (ref 0.10–4.00)

## 2018-11-22 LAB — TSH: TSH: 0.81 u[IU]/mL (ref 0.35–4.50)

## 2018-11-22 MED ORDER — HYDROCODONE-ACETAMINOPHEN 5-325 MG PO TABS: 1.0000 | ORAL_TABLET | Freq: Four times a day (QID) | ORAL | 0 refills | Status: DC | PRN

## 2018-11-22 MED ORDER — VITAMIN D (ERGOCALCIFEROL) 1.25 MG (50000 UNIT) PO CAPS: 50000.0000 [IU] | ORAL_CAPSULE | ORAL | 0 refills | Status: DC

## 2018-11-22 NOTE — Patient Instructions (Signed)
Please call the [facility] Dept at [phone] for the [place] had the Tdap and Pneumovax shots today  You will be contacted regarding the referral for: colonoscopy, and eye doctor  Please continue all other medications as before, and refills have been done if requested - the hydrocodone  Please have the pharmacy call with any other refills you may need.  Please continue your efforts at being more active, low cholesterol diet, and weight control.  You are otherwise up to date with prevention measures today.  Please keep your appointments with your specialists as you may have planned  Please go to the LAB in the Basement (turn left off the elevator) for the tests to be done today  You will be contacted by phone if any changes need to be made immediately.  Otherwise, you will receive a letter about your results with an explanation, but please check with MyChart first.  Please remember to sign up for MyChart if you have not done so, as this will be important to you in the future with finding out test results, communicating by private email, and scheduling acute appointments online when needed.  Please return in 6 months, or sooner if needed, with Lab testing done 3-5 days before

## 2018-11-22 NOTE — Progress Notes (Signed)
Subjective:    Patient ID: Peter Schmidt, male    DOB: 1956-05-30, 63 y.o.   MRN: [id number]  HPI  Here for wellness and f/u;  Overall doing ok;  Pt denies Chest pain, wheezing, orthopnea, PND, worsening LE edema, palpitations, dizziness or syncope.  Pt denies neurological change such as new headache, facial or extremity weakness.  Pt denies polydipsia, polyuria, or low sugar symptoms. Pt states overall good compliance with treatment and medications, good tolerability, and has been trying to follow appropriate diet.  Pt denies worsening depressive symptoms, suicidal ideation or panic. No fever, night sweats, wt loss, loss of appetite, or other constitutional symptoms.  Pt states good ability with ADL's, has low fall risk, home safety reviewed and adequate, no other significant changes in hearing or vision, and only occasionally active with exercise.  Peak wt has been 270 in past, was 257 in 2012, and has been trying to lose wt gradually since then, and 20 lb in last yr with better diet, with lower carb diet.   Wt Readings from Last 3 Encounters:  11/22/18 182 lb (82.6 kg)  09/18/18 184 lb (83.5 kg)  10/27/17 214 lb (97.1 kg)  S/p torn right rotater cuff x several months, seen per Dr Smith/sport med, improved with excercises, and has avoided surgury so far, but pain persists, asks for pain med refill Also, c/o URI symptoms with feverish, HA, ST, cough and mild sob for 3 days.  No known COVID19 exposure Past Medical History:  Diagnosis Date  . ALLERGIC RHINITIS 05/05/2009  . DIABETES MELLITUS, TYPE II 03/24/2008  . HEPATITIS B 06/01/2007  . HYPERLIPIDEMIA 07/03/2007  . HYPERTENSION 06/04/2007  . Impaired glucose tolerance 11/05/2014  . RECTAL BLEEDING, HX OF 06/01/2007  . SPONDYLOSIS, CERVICAL 06/01/2007  . TOBACCO ABUSE 06/01/2007   Past Surgical History:  Procedure Laterality Date  . WISDOM TOOTH EXTRACTION      reports that he has been smoking cigarettes. He has been smoking about 0.25 packs per  day. He has never used smokeless tobacco. He reports that he does not drink alcohol or use drugs. family history includes Alcohol abuse in his father; Breast cancer in his mother; Cancer in his father; Diabetes in his mother; Heart disease in his mother; Prostate cancer in his father; Stroke in his father and mother. No Known Allergies Current Outpatient Medications on File Prior to Visit  Medication Sig Dispense Refill  . losartan (COZAAR) 50 MG tablet Take 1 tablet (50 mg total) by mouth daily. 90 tablet 0  . metFORMIN (GLUCOPHAGE-XR) 500 MG 24 hr tablet TAKE 1 TABLET BY MOUTH ONCE DAILY WITH BREAKFAST 90 tablet 0  . Na Sulfate-K Sulfate-Mg Sulf 17.5-3.13-1.6 GM/177ML SOLN Suprep (no substitutions)-TAKE AS DIRECTED. 354 mL 0  . nitroGLYCERIN (NITRODUR - DOSED IN MG/24 HR) 0.2 mg/hr patch 1/4 patch daily 30 patch 1  . Omega-3 Fatty Acids (FISH OIL PO) Take 1 tablet by mouth daily.    . rosuvastatin (CRESTOR) 20 MG tablet TAKE 1 TABLET BY MOUTH DAILY 90 tablet 0  . vardenafil (LEVITRA) 20 MG tablet Take 1 tablet (20 mg total) by mouth daily as needed for erectile dysfunction. 1 by mouth every other day as needed 10 tablet 11   No current facility-administered medications on file prior to visit.    Review of Systems Constitutional: Negative for other unusual diaphoresis, sweats, appetite or weight changes HENT: Negative for other worsening hearing loss, ear pain, facial swelling, mouth sores or neck stiffness.   Eyes:  Negative for other worsening pain, redness or other visual disturbance.  Respiratory: Negative for other stridor or swelling Cardiovascular: Negative for other palpitations or other chest pain  Gastrointestinal: Negative for worsening diarrhea or loose stools, blood in stool, distention or other pain Genitourinary: Negative for hematuria, flank pain or other change in urine volume.  Musculoskeletal: Negative for myalgias or other joint swelling.  Skin: Negative for other color  change, or other wound or worsening drainage.  Neurological: Negative for other syncope or numbness. Hematological: Negative for other adenopathy or swelling Psychiatric/Behavioral: Negative for hallucinations, other worsening agitation, SI, self-injury, or new decreased concentration \\All  other system neg per pt    Objective:   Physical Exam BP 120/76   Pulse 79   Temp 99 F (37.2 C) (Oral)   Ht 5\' 8"  (1.727 m)   Wt 182 lb (82.6 kg)   SpO2 99%   BMI 27.67 kg/m  VS noted, mild ill Constitutional: Pt is oriented to person, place, and time. Appears well-developed and well-nourished, in no significant distress and comfortable Head: Normocephalic and atraumatic,  Bilat tm's with mild erythema.  Max sinus areas non tender.  Pharynx with mild erythema, no exudate  Eyes: Conjunctivae and EOM are normal. Pupils are equal, round, and reactive to light Right Ear: External ear normal without discharge Left Ear: External ear normal without discharge Nose: Nose without discharge or deformity Mouth/Throat: Oropharynx is without other ulcerations and moist  Neck: Normal range of motion. Neck supple. No JVD present. No tracheal deviation present or significant neck LA or mass Cardiovascular: Normal rate, regular rhythm, normal heart sounds and intact distal pulses.   Pulmonary/Chest: WOB normal and breath sounds without rales or wheezing  Abdominal: Soft. Bowel sounds are normal. NT. No HSM  Musculoskeletal: Normal range of motion. Exhibits no edema Lymphadenopathy: Has no other cervical adenopathy.  Neurological: Pt is alert and oriented to person, place, and time. Pt has normal reflexes. No cranial nerve deficit. Motor grossly intact, Gait intact Skin: Skin is warm and dry. No rash noted or new ulcerations Psychiatric:  Has normal mood and affect. Behavior is normal without agitation No other exam findings Lab Results  Component Value Date   WBC 5.8 11/22/2018   HGB 12.6 (L) 11/22/2018    HCT 37.0 (L) 11/22/2018   PLT 195.0 11/22/2018   GLUCOSE 61 (L) 11/22/2018   CHOL 146 11/22/2018   TRIG 52.0 11/22/2018   HDL 74.80 11/22/2018   LDLDIRECT 145.3 07/21/2008   LDLCALC 61 11/22/2018   ALT 16 11/22/2018   AST 15 11/22/2018   NA 139 11/22/2018   K 3.8 11/22/2018   CL 102 11/22/2018   CREATININE 0.69 11/22/2018   BUN 5 (L) 11/22/2018   CO2 31 11/22/2018   TSH 0.81 11/22/2018   PSA 0.41 11/22/2018   HGBA1C 6.2 11/22/2018   MICROALBUR <0.7 11/22/2018      Assessment & Plan:

## 2018-11-23 ENCOUNTER — Telehealth: Payer: Self-pay

## 2018-11-23 LAB — HEPATITIS C ANTIBODY
Hepatitis C Ab: NONREACTIVE
SIGNAL TO CUT-OFF: 0.02 (ref <1.00)

## 2018-11-23 LAB — HIV ANTIBODY (ROUTINE TESTING W REFLEX): HIV 1&2 Ab, 4th Generation: NONREACTIVE

## 2018-11-23 NOTE — Telephone Encounter (Signed)
Patient is calling back regarding his lab results.[place]- [phone]

## 2018-11-23 NOTE — Telephone Encounter (Signed)
Called pt, LVM.   CRM created.  

## 2018-11-23 NOTE — Telephone Encounter (Signed)
Pt has been informed of results and expressed understanding.  °

## 2018-11-23 NOTE — Telephone Encounter (Signed)
-----   Message from Biagio Borg, MD sent at 11/22/2018  1:50 PM EDT ----- Letter sent, cont same tx except  The test results show that your current treatment is OK, except the Vitamin D level is severely low.  We will send a prescription for Vit D 50000 units weekly for 12 wks, then you can change to taking OTC Vit D3 at 2000 units per day indefinitely.   Ronnae Kaser to please inform pt, I will do rx

## 2018-11-24 ENCOUNTER — Encounter: Payer: Self-pay | Admitting: Internal Medicine

## 2018-11-24 DIAGNOSIS — J069 Acute upper respiratory infection, unspecified: Secondary | ICD-10-CM | POA: Insufficient documentation

## 2018-11-24 NOTE — Assessment & Plan Note (Signed)
stable overall by history and exam, recent data reviewed with pt, and pt to continue medical treatment as before,  to f/u any worsening symptoms or concerns  

## 2018-11-24 NOTE — Assessment & Plan Note (Signed)

## 2018-11-24 NOTE — Assessment & Plan Note (Addendum)
C/w with likely viral illness, cant r/o COVID - pt recommended to seek testing  In addition to the time spent performing CPE, I spent an additional 25 minutes face to face,in which greater than 50% of this time was spent in counseling and coordination of care for patient's acute illness as documented, including the differential dx, treatment, further evaluation and other management of URI, HTN, HLD, hyperglycemia

## 2018-12-26 ENCOUNTER — Telehealth: Payer: Self-pay | Admitting: Internal Medicine

## 2018-12-26 ENCOUNTER — Encounter: Payer: Self-pay | Admitting: Internal Medicine

## 2018-12-26 MED ORDER — HYDROCODONE-ACETAMINOPHEN 5-325 MG PO TABS: 1.0000 | ORAL_TABLET | Freq: Four times a day (QID) | ORAL | 0 refills | Status: DC | PRN

## 2018-12-26 NOTE — Telephone Encounter (Signed)
Medication Refill - Medication: HYDROcodone-acetaminophen (NORCO/VICODIN) 5-325 MG tablet  Has the patient contacted their pharmacy? no (Agent: If no, request that the patient contact the pharmacy for the refill.) (Agent: If yes, when and what did the pharmacy advise?)  Preferred Pharmacy (with phone number or street name):  [facility] [phone] - [place], [place] - [address] [phone] (Phone) [phone] (Fax)     Agent: Please be advised that RX refills may take up to 3 business days. We ask that you follow-up with your pharmacy.

## 2018-12-26 NOTE — Telephone Encounter (Signed)
Done erx 

## 2019-01-11 ENCOUNTER — Other Ambulatory Visit: Payer: Self-pay | Admitting: *Deleted

## 2019-01-11 MED ORDER — LOSARTAN POTASSIUM 50 MG PO TABS: 50.0000 mg | ORAL_TABLET | Freq: Every day | ORAL | 2 refills | Status: DC

## 2019-01-11 MED ORDER — METFORMIN HCL ER 500 MG PO TB24: ORAL_TABLET | ORAL | 2 refills | Status: DC

## 2019-02-20 ENCOUNTER — Telehealth: Payer: Self-pay | Admitting: Internal Medicine

## 2019-02-20 MED ORDER — HYDROCODONE-ACETAMINOPHEN 5-325 MG PO TABS: 1.0000 | ORAL_TABLET | Freq: Four times a day (QID) | ORAL | 0 refills | Status: DC | PRN

## 2019-02-20 NOTE — Telephone Encounter (Signed)
Done erx 

## 2019-02-20 NOTE — Telephone Encounter (Signed)
Medication Refill - Medication: HYDROcodone-acetaminophen (NORCO/VICODIN) 5-325 MG tablet  Has the patient contacted their pharmacy? yes (Agent: If no, request that the patient contact the pharmacy for the refill.) (Agent: If yes, when and what did the pharmacy advise?)  Preferred Pharmacy (with phone number or street name):  [facility] [phone] - [place], [place] - [address] [place] [phone] (Phone) [phone] (Fax)   Agent: Please be advised that RX refills may take up to 3 business days. We ask that you follow-up with your pharmacy.

## 2019-02-20 NOTE — Telephone Encounter (Signed)
See request for refill on Hydrocodone-Acetaminophen.

## 2019-04-18 ENCOUNTER — Telehealth: Payer: Self-pay

## 2019-04-18 MED ORDER — ROSUVASTATIN CALCIUM 20 MG PO TABS: 20.0000 mg | ORAL_TABLET | Freq: Every day | ORAL | 1 refills | Status: DC

## 2019-04-18 NOTE — Telephone Encounter (Signed)
Done erx 

## 2019-05-06 ENCOUNTER — Other Ambulatory Visit: Payer: Self-pay | Admitting: Internal Medicine

## 2019-05-06 NOTE — Telephone Encounter (Signed)
Medication Refill - Medication: HYDROcodone-acetaminophen (NORCO/VICODIN) 5-325 MG tablet   Has the patient contacted their pharmacy? Yes.   (Agent: If no, request that the patient contact the pharmacy for the refill.) (Agent: If yes, when and what did the pharmacy advise?)  Preferred Pharmacy (with phone number or street name): [facility] [id number] - [place], [place]: Please be advised that RX refills may take up to 3 business days. We ask that you follow-up with your pharmacy.

## 2019-05-07 MED ORDER — HYDROCODONE-ACETAMINOPHEN 5-325 MG PO TABS: 1.0000 | ORAL_TABLET | Freq: Four times a day (QID) | ORAL | 0 refills | Status: DC | PRN

## 2019-05-07 NOTE — Telephone Encounter (Signed)
Done erx 

## 2019-07-12 ENCOUNTER — Other Ambulatory Visit: Payer: Self-pay | Admitting: Internal Medicine

## 2019-07-12 MED ORDER — HYDROCODONE-ACETAMINOPHEN 5-325 MG PO TABS: 1.0000 | ORAL_TABLET | Freq: Four times a day (QID) | ORAL | 0 refills | Status: DC | PRN

## 2019-07-12 NOTE — Telephone Encounter (Signed)
[place] Controlled Database Checked Last filled: 05/07/19 # 30 LOV w/you: 11/22/18 Next appt w/you: 11/26/19

## 2019-07-12 NOTE — Telephone Encounter (Signed)
Done erx 

## 2019-07-12 NOTE — Telephone Encounter (Signed)
Pt called in to request a refill for HYDROcodone-acetaminophen (NORCO/VICODIN) 5-325 MG tablet    Pharmacy: [place], [place]

## 2019-07-16 ENCOUNTER — Other Ambulatory Visit: Payer: Self-pay | Admitting: Internal Medicine

## 2019-08-22 ENCOUNTER — Ambulatory Visit: Payer: 59 | Admitting: Internal Medicine

## 2019-08-27 ENCOUNTER — Other Ambulatory Visit: Payer: Self-pay

## 2019-08-27 ENCOUNTER — Encounter: Payer: Self-pay | Admitting: Internal Medicine

## 2019-08-27 ENCOUNTER — Ambulatory Visit (INDEPENDENT_AMBULATORY_CARE_PROVIDER_SITE_OTHER): Payer: 59 | Admitting: Internal Medicine

## 2019-08-27 VITALS — BP 132/78 | HR 73 | Temp 98.5°F | Ht 68.0 in | Wt 183.5 lb

## 2019-08-27 DIAGNOSIS — I10 Essential (primary) hypertension: Secondary | ICD-10-CM | POA: Diagnosis not present

## 2019-08-27 DIAGNOSIS — M47812 Spondylosis without myelopathy or radiculopathy, cervical region: Secondary | ICD-10-CM

## 2019-08-27 MED ORDER — HYDROCODONE-ACETAMINOPHEN 7.5-325 MG PO TABS: 1.0000 | ORAL_TABLET | Freq: Every day | ORAL | 0 refills | Status: DC | PRN

## 2019-08-27 NOTE — Patient Instructions (Signed)
Please take all new medication as prescribed - the hydrocodone  Please continue all other medications as before, and refills have been done if requested.  Please have the pharmacy call with any other refills you may need.  Please continue your efforts at being more active, low cholesterol diet, and weight control.  Please keep your appointments with your specialists as you may have planned

## 2019-08-27 NOTE — Progress Notes (Signed)
Subjective:    Patient ID: Peter Schmidt, male    DOB: 05-30-1956, 64 y.o.   MRN: [id number]  HPI  Here to f/u chronic neck pain midline and right upper back, mild worse, current pain med not working well, no radicular symptoms. S/p covid shot x 2.  Wt stable recently, but has been high of 275 in 2018 Wt Readings from Last 3 Encounters:  08/27/19 183 lb 8 oz (83.2 kg)  11/22/18 182 lb (82.6 kg)  09/18/18 184 lb (83.5 kg)   Past Medical History:  Diagnosis Date  . ALLERGIC RHINITIS 05/05/2009  . DIABETES MELLITUS, TYPE II 03/24/2008  . HEPATITIS B 06/01/2007  . HYPERLIPIDEMIA 07/03/2007  . HYPERTENSION 06/04/2007  . Impaired glucose tolerance 11/05/2014  . RECTAL BLEEDING, HX OF 06/01/2007  . SPONDYLOSIS, CERVICAL 06/01/2007  . TOBACCO ABUSE 06/01/2007   Past Surgical History:  Procedure Laterality Date  . WISDOM TOOTH EXTRACTION      reports that he has been smoking cigarettes. He has been smoking about 0.25 packs per day. He has never used smokeless tobacco. He reports that he does not drink alcohol or use drugs. family history includes Alcohol abuse in his father; Breast cancer in his mother; Cancer in his father; Diabetes in his mother; Heart disease in his mother; Prostate cancer in his father; Stroke in his father and mother. No Known Allergies Current Outpatient Medications on File Prior to Visit  Medication Sig Dispense Refill  . losartan (COZAAR) 50 MG tablet Take 1 tablet (50 mg total) by mouth daily. Annual appt due in May must see provider for future refill 90 tablet 0  . metFORMIN (GLUCOPHAGE-XR) 500 MG 24 hr tablet TAKE 1 TABLET BY MOUTH EVERY DAY WITH BREAKFAST 90 tablet 0  . Na Sulfate-K Sulfate-Mg Sulf 17.5-3.13-1.6 GM/177ML SOLN Suprep (no substitutions)-TAKE AS DIRECTED. 354 mL 0  . nitroGLYCERIN (NITRODUR - DOSED IN MG/24 HR) 0.2 mg/hr patch 1/4 patch daily 30 patch 1  . Omega-3 Fatty Acids (FISH OIL PO) Take 1 tablet by mouth daily.    . rosuvastatin (CRESTOR) 20 MG  tablet Take 1 tablet (20 mg total) by mouth daily. 90 tablet 1  . vardenafil (LEVITRA) 20 MG tablet Take 1 tablet (20 mg total) by mouth daily as needed for erectile dysfunction. 1 by mouth every other day as needed 10 tablet 11  . Vitamin D, Ergocalciferol, (DRISDOL) 1.25 MG (50000 UT) CAPS capsule Take 1 capsule (50,000 Units total) by mouth every 7 (seven) days. 12 capsule 0   No current facility-administered medications on file prior to visit.   Review of Systems All otherwise neg per pt     Objective:   Physical Exam BP 132/78 (BP Location: Left Arm, Patient Position: Sitting, Cuff Size: Normal)   Pulse 73   Temp 98.5 F (36.9 C) (Oral)   Ht 5\' 8"  (1.727 m)   Wt 183 lb 8 oz (83.2 kg)   SpO2 98%   BMI 27.90 kg/m  VS noted,  Constitutional: Pt appears in NAD HENT: Head: NCAT.  Right Ear: External ear normal.  Left Ear: External ear normal.  Eyes: . Pupils are equal, round, and reactive to light. Conjunctivae and EOM are normal Nose: without d/c or deformity Neck: Neck supple. Gross normal ROM Cardiovascular: Normal rate and regular rhythm.   Pulmonary/Chest: Effort normal and breath sounds without rales or wheezing.  Abd:  Soft, NT, ND, + BS, no organomegaly Neurological: Pt is alert. At baseline orientation, motor grossly intact Skin:  Skin is warm. No rashes, other new lesions, no LE edema Psychiatric: Pt behavior is normal without agitation  All otherwise neg per pt Lab Results  Component Value Date   WBC 5.8 11/22/2018   HGB 12.6 (L) 11/22/2018   HCT 37.0 (L) 11/22/2018   PLT 195.0 11/22/2018   GLUCOSE 61 (L) 11/22/2018   CHOL 146 11/22/2018   TRIG 52.0 11/22/2018   HDL 74.80 11/22/2018   LDLDIRECT 145.3 07/21/2008   LDLCALC 61 11/22/2018   ALT 16 11/22/2018   AST 15 11/22/2018   NA 139 11/22/2018   K 3.8 11/22/2018   CL 102 11/22/2018   CREATININE 0.69 11/22/2018   BUN 5 (L) 11/22/2018   CO2 31 11/22/2018   TSH 0.81 11/22/2018   PSA 0.41 11/22/2018    HGBA1C 6.2 11/22/2018   MICROALBUR <0.7 11/22/2018      Assessment & Plan:

## 2019-09-01 ENCOUNTER — Encounter: Payer: Self-pay | Admitting: Internal Medicine

## 2019-09-01 NOTE — Assessment & Plan Note (Addendum)
With chronic pain, for vicodin 7.5 qd prn  I spent 21 minutes in preparing to see the patient by review of recent labs, imaging and procedures, obtaining and reviewing separately obtained history, communicating with the patient and family or caregiver, ordering medications, tests or procedures, and documenting clinical information in the EHR including the differential Dx, treatment, and any further evaluation and other management of chronic neck pain, HTN

## 2019-09-01 NOTE — Assessment & Plan Note (Signed)
stable overall by history and exam, recent data reviewed with pt, and pt to continue medical treatment as before,  to f/u any worsening symptoms or concerns  

## 2019-09-26 ENCOUNTER — Telehealth: Payer: Self-pay | Admitting: Internal Medicine

## 2019-09-26 MED ORDER — HYDROCODONE-ACETAMINOPHEN 7.5-325 MG PO TABS: 1.0000 | ORAL_TABLET | Freq: Every day | ORAL | 0 refills | Status: DC | PRN

## 2019-09-26 NOTE — Telephone Encounter (Signed)
New message:   1.Medication Requested: HYDROcodone-acetaminophen (NORCO) 7.5-325 MG tablet 2. Pharmacy (Name, Street, [place]): [facility] [phone] - [place], [address] AT [place] 3. On Med List: Yes  4. Last Visit with PCP: 08/27/19  5. Next visit date with PCP:   Agent: Please be advised that RX refills may take up to 3 business days. We ask that you follow-up with your pharmacy.

## 2019-09-26 NOTE — Telephone Encounter (Signed)
Done erx 

## 2019-10-14 ENCOUNTER — Other Ambulatory Visit: Payer: Self-pay | Admitting: Internal Medicine

## 2019-11-04 ENCOUNTER — Telehealth: Payer: Self-pay

## 2019-11-04 MED ORDER — HYDROCODONE-ACETAMINOPHEN 7.5-325 MG PO TABS: 1.0000 | ORAL_TABLET | Freq: Every day | ORAL | 0 refills | Status: DC | PRN

## 2019-11-04 NOTE — Telephone Encounter (Signed)
Done erx 

## 2019-11-04 NOTE — Telephone Encounter (Signed)
1.Medication Requested:HYDROcodone-acetaminophen (NORCO) 7.5-325 MG tablet  2. Pharmacy (Name, Street, [place]):[facility] [phone] - [place], [address] AT [place]  3. On Med List: Yes   4. Last Visit with PCP: 3.2.2021  5. Next visit date with PCP: n/a   Agent: Please be advised that RX refills may take up to 3 business days. We ask that you follow-up with your pharmacy.

## 2019-11-26 ENCOUNTER — Telehealth: Payer: Self-pay | Admitting: Internal Medicine

## 2019-11-26 ENCOUNTER — Encounter: Payer: 59 | Admitting: Internal Medicine

## 2019-11-26 NOTE — Telephone Encounter (Signed)
New message:    Pt is calling and states he was never informed of his appt being canceled for today 11/26/19 due to Dr. Jenny Reichmann being out of the office. Pt is now scheduled for 12/27/19 and would like to know if he can be seen sooner. I advised the pt that Dr. Jenny Reichmann only does 2 physicals in the morning and 2 in the afternoon. Pt states he would also like a call to see if there is some type of cancer screening he can do. Please advise.

## 2019-11-27 NOTE — Telephone Encounter (Signed)
MD is out of the office this week. Will hold for his return to address msg../lmb

## 2019-11-27 NOTE — Telephone Encounter (Signed)
[place] for scheduling pt for when he prefers, and can consider cancer screening as appropriate

## 2019-11-28 NOTE — Telephone Encounter (Signed)
LVM for pt to call clinic back to schedule a sooner apptmnt with Dr. Jenny Reichmann.

## 2019-12-12 ENCOUNTER — Encounter: Payer: Self-pay | Admitting: Internal Medicine

## 2019-12-12 ENCOUNTER — Ambulatory Visit (INDEPENDENT_AMBULATORY_CARE_PROVIDER_SITE_OTHER): Payer: 59 | Admitting: Internal Medicine

## 2019-12-12 ENCOUNTER — Ambulatory Visit (INDEPENDENT_AMBULATORY_CARE_PROVIDER_SITE_OTHER): Payer: 59

## 2019-12-12 ENCOUNTER — Other Ambulatory Visit: Payer: Self-pay

## 2019-12-12 VITALS — BP 150/58 | HR 61 | Temp 97.8°F | Ht 68.0 in | Wt 185.0 lb

## 2019-12-12 DIAGNOSIS — E785 Hyperlipidemia, unspecified: Secondary | ICD-10-CM | POA: Diagnosis not present

## 2019-12-12 DIAGNOSIS — R079 Chest pain, unspecified: Secondary | ICD-10-CM

## 2019-12-12 DIAGNOSIS — F411 Generalized anxiety disorder: Secondary | ICD-10-CM

## 2019-12-12 DIAGNOSIS — R7302 Impaired glucose tolerance (oral): Secondary | ICD-10-CM

## 2019-12-12 DIAGNOSIS — E538 Deficiency of other specified B group vitamins: Secondary | ICD-10-CM

## 2019-12-12 DIAGNOSIS — E559 Vitamin D deficiency, unspecified: Secondary | ICD-10-CM

## 2019-12-12 DIAGNOSIS — I10 Essential (primary) hypertension: Secondary | ICD-10-CM

## 2019-12-12 DIAGNOSIS — Z0001 Encounter for general adult medical examination with abnormal findings: Secondary | ICD-10-CM | POA: Diagnosis not present

## 2019-12-12 LAB — CBC WITH DIFFERENTIAL/PLATELET
Basophils Absolute: 0.1 10*3/uL (ref 0.0–0.1)
Basophils Relative: 0.7 % (ref 0.0–3.0)
Eosinophils Absolute: 0.3 10*3/uL (ref 0.0–0.7)
Eosinophils Relative: 3.6 % (ref 0.0–5.0)
HCT: 38.1 % — ABNORMAL LOW (ref 39.0–52.0)
Hemoglobin: 12.8 g/dL — ABNORMAL LOW (ref 13.0–17.0)
Lymphocytes Relative: 30.3 % (ref 12.0–46.0)
Lymphs Abs: 2.3 10*3/uL (ref 0.7–4.0)
MCHC: 33.5 g/dL (ref 30.0–36.0)
MCV: 82.7 fl (ref 78.0–100.0)
Monocytes Absolute: 0.6 10*3/uL (ref 0.1–1.0)
Monocytes Relative: 7.2 % (ref 3.0–12.0)
Neutro Abs: 4.5 10*3/uL (ref 1.4–7.7)
Neutrophils Relative %: 58.2 % (ref 43.0–77.0)
Platelets: 180 10*3/uL (ref 150.0–400.0)
RBC: 4.61 Mil/uL (ref 4.22–5.81)
RDW: 14.3 % (ref 11.5–15.5)
WBC: 7.7 10*3/uL (ref 4.0–10.5)

## 2019-12-12 LAB — HEPATIC FUNCTION PANEL
ALT: 11 U/L (ref 0–53)
AST: 15 U/L (ref 0–37)
Albumin: 4.6 g/dL (ref 3.5–5.2)
Alkaline Phosphatase: 64 U/L (ref 39–117)
Bilirubin, Direct: 0.1 mg/dL (ref 0.0–0.3)
Total Bilirubin: 0.5 mg/dL (ref 0.2–1.2)
Total Protein: 7.1 g/dL (ref 6.0–8.3)

## 2019-12-12 LAB — BASIC METABOLIC PANEL
BUN: 9 mg/dL (ref 6–23)
CO2: 31 mEq/L (ref 19–32)
Calcium: 9.6 mg/dL (ref 8.4–10.5)
Chloride: 103 mEq/L (ref 96–112)
Creatinine, Ser: 0.73 mg/dL (ref 0.40–1.50)
GFR: 131.47 mL/min (ref >60.00)
Glucose, Bld: 65 mg/dL — ABNORMAL LOW (ref 70–99)
Potassium: 3.7 mEq/L (ref 3.5–5.1)
Sodium: 138 mEq/L (ref 135–145)

## 2019-12-12 LAB — PSA: PSA: 0.3 ng/mL (ref 0.10–4.00)

## 2019-12-12 LAB — URINALYSIS, ROUTINE W REFLEX MICROSCOPIC
Bilirubin Urine: NEGATIVE
Hgb urine dipstick: NEGATIVE
Leukocytes,Ua: NEGATIVE
Nitrite: NEGATIVE
RBC / HPF: NONE SEEN (ref 0–?)
Specific Gravity, Urine: 1.02 (ref 1.000–1.030)
Total Protein, Urine: NEGATIVE
Urine Glucose: 100 — AB
Urobilinogen, UA: 0.2 (ref 0.0–1.0)
pH: 5.5 (ref 5.0–8.0)

## 2019-12-12 LAB — LIPID PANEL
Cholesterol: 148 mg/dL (ref 0–200)
HDL: 71.9 mg/dL (ref >39.00)
LDL Cholesterol: 67 mg/dL (ref 0–99)
NonHDL: 75.81
Total CHOL/HDL Ratio: 2
Triglycerides: 46 mg/dL (ref 0.0–149.0)
VLDL: 9.2 mg/dL (ref 0.0–40.0)

## 2019-12-12 LAB — HEMOGLOBIN A1C: Hgb A1c MFr Bld: 5.8 % (ref 4.6–6.5)

## 2019-12-12 LAB — VITAMIN D 25 HYDROXY (VIT D DEFICIENCY, FRACTURES): VITD: 22.45 ng/mL — ABNORMAL LOW (ref 30.00–100.00)

## 2019-12-12 LAB — VITAMIN B12: Vitamin B-12: 214 pg/mL (ref 211–911)

## 2019-12-12 LAB — TSH: TSH: 0.65 u[IU]/mL (ref 0.35–4.50)

## 2019-12-12 MED ORDER — NICOTINE 21 MG/24HR TD PT24
21.0000 mg | MEDICATED_PATCH | Freq: Every day | TRANSDERMAL | 0 refills | Status: DC
Start: 2019-12-12 — End: 2020-08-31

## 2019-12-12 MED ORDER — HYDROCODONE-ACETAMINOPHEN 7.5-325 MG PO TABS: 1.0000 | ORAL_TABLET | Freq: Every day | ORAL | 0 refills | Status: DC | PRN

## 2019-12-12 MED ORDER — LOSARTAN POTASSIUM 50 MG PO TABS: 50.0000 mg | ORAL_TABLET | Freq: Every day | ORAL | 3 refills | Status: DC

## 2019-12-12 MED ORDER — ROSUVASTATIN CALCIUM 20 MG PO TABS: 20.0000 mg | ORAL_TABLET | Freq: Every day | ORAL | 3 refills | Status: DC

## 2019-12-12 MED ORDER — BUPROPION HCL ER (XL) 150 MG PO TB24: 150.0000 mg | ORAL_TABLET | Freq: Every day | ORAL | 3 refills | Status: DC

## 2019-12-12 MED ORDER — METFORMIN HCL ER 500 MG PO TB24: ORAL_TABLET | ORAL | 3 refills | Status: DC

## 2019-12-12 NOTE — Patient Instructions (Signed)
Please take all new medication as prescribed - the wellbutrin and nicoderm  Please remember to check your BP at home on a regular basis, with the goal to be less than 140/90  Please continue all other medications as before, and refills have been done if requested.  Please have the pharmacy call with any other refills you may need.  Please continue your efforts at being more active, low cholesterol diet, and weight control.  You are otherwise up to date with prevention measures today.  You will be contacted regarding the referral for: colonoscopy, and eye doctor  Please keep your appointments with your specialists as you may have planned  Please go to the XRAY Department in the first floor for the x-ray testing  Please go to the LAB at the blood drawing area for the tests to be done  You will be contacted by phone if any changes need to be made immediately.  Otherwise, you will receive a letter about your results with an explanation, but please check with MyChart first.  Please remember to sign up for MyChart if you have not done so, as this will be important to you in the future with finding out test results, communicating by private email, and scheduling acute appointments online when needed.  Please make an Appointment to return for your 1 year visit, or sooner if needed

## 2019-12-12 NOTE — Progress Notes (Addendum)
Subjective:    Patient ID: Peter Schmidt, male    DOB: [birth year], 64 y.o.   MRN: [id number]  HPI  Here for wellness and f/u;  Overall doing ok;  Pt denies worsening SOB, DOE, wheezing, orthopnea, PND, worsening LE edema, palpitations, dizziness or syncope.  Pt denies neurological change such as new headache, facial or extremity weakness.  Pt denies polydipsia, polyuria, or low sugar symptoms. Pt states overall good compliance with treatment and medications, good tolerability, and has been trying to follow appropriate diet.  Pt denies worsening depressive symptoms, suicidal ideation or panic. No fever, night sweats, wt loss, loss of appetite, or other constitutional symptoms.  Pt states good ability with ADL's, has low fall risk, home safety reviewed and adequate, no other significant changes in hearing or vision, and only occasionally active with exercise. Due for colonoscopy BP Readings from Last 3 Encounters:  12/12/19 (!) 150/58  08/27/19 132/78  11/22/18 120/76  BP at home < 140/90.  Due for optho f/u.  Wants to quit smoking, asks for wellbutrin and nicoderm as he is ready.   Also c/o unusual right upper chest numbness and sort of "fluid draining" feeling hard to otherwise characterize, but is non positional, non exertional, non pleuritic, mild, mostly constant in last 1 week, and nothing seems to make better or worse. Does also have intermittent right postlateral neck pain mild for several months c/w possible underlying DJD or DDD Past Medical History:  Diagnosis Date  . ALLERGIC RHINITIS 05/05/2009  . DIABETES MELLITUS, TYPE II 03/24/2008  . HEPATITIS B 06/01/2007  . HYPERLIPIDEMIA 07/03/2007  . HYPERTENSION 06/04/2007  . Impaired glucose tolerance 11/05/2014  . RECTAL BLEEDING, HX OF 06/01/2007  . SPONDYLOSIS, CERVICAL 06/01/2007  . TOBACCO ABUSE 06/01/2007   Past Surgical History:  Procedure Laterality Date  . WISDOM TOOTH EXTRACTION      reports that he has been smoking cigarettes. He has  been smoking about 0.25 packs per day. He has never used smokeless tobacco. He reports that he does not drink alcohol and does not use drugs. family history includes Alcohol abuse in his father; Breast cancer in his mother; Cancer in his father; Diabetes in his mother; Heart disease in his mother; Prostate cancer in his father; Stroke in his father and mother. No Known Allergies Current Outpatient Medications on File Prior to Visit  Medication Sig Dispense Refill  . Na Sulfate-K Sulfate-Mg Sulf 17.5-3.13-1.6 GM/177ML SOLN Suprep (no substitutions)-TAKE AS DIRECTED. 354 mL 0  . nitroGLYCERIN (NITRODUR - DOSED IN MG/24 HR) 0.2 mg/hr patch 1/4 patch daily 30 patch 1  . Omega-3 Fatty Acids (FISH OIL PO) Take 1 tablet by mouth daily.    . vardenafil (LEVITRA) 20 MG tablet Take 1 tablet (20 mg total) by mouth daily as needed for erectile dysfunction. 1 by mouth every other day as needed 10 tablet 11   No current facility-administered medications on file prior to visit.   Review of Systems All otherwise neg per pt     Objective:   Physical Exam BP (!) 150/58 (BP Location: Left Arm, Patient Position: Sitting, Cuff Size: Large)   Pulse 61   Temp 97.8 F (36.6 C) (Oral)   Ht 5\' 8"  (1.727 m)   Wt 185 lb (83.9 kg)   SpO2 98%   BMI 28.13 kg/m  VS noted,  Constitutional: Pt appears in NAD HENT: Head: NCAT.  Right Ear: External ear normal.  Left Ear: External ear normal.  Eyes: . Pupils are equal,  round, and reactive to light. Conjunctivae and EOM are normal Nose: without d/c or deformity Neck: Neck supple. Gross normal ROM Cardiovascular: Normal rate and regular rhythm.   Pulmonary/Chest: Effort normal and breath sounds without rales or wheezing.  Abd:  Soft, NT, ND, + BS, no organomegaly Neurological: Pt is alert. At baseline orientation, motor grossly intact Skin: Skin is warm. No rashes, other new lesions, no LE edema Psychiatric: Pt behavior is normal without agitation , 1-2+  nervous All otherwise neg per pt Lab Results  Component Value Date   WBC 7.7 12/12/2019   HGB 12.8 (L) 12/12/2019   HCT 38.1 (L) 12/12/2019   PLT 180.0 12/12/2019   GLUCOSE 65 (L) 12/12/2019   CHOL 148 12/12/2019   TRIG 46.0 12/12/2019   HDL 71.90 12/12/2019   LDLDIRECT 145.3 07/21/2008   LDLCALC 67 12/12/2019   ALT 11 12/12/2019   AST 15 12/12/2019   NA 138 12/12/2019   K 3.7 12/12/2019   CL 103 12/12/2019   CREATININE 0.73 12/12/2019   BUN 9 12/12/2019   CO2 31 12/12/2019   TSH 0.65 12/12/2019   PSA 0.30 12/12/2019   HGBA1C 5.8 12/12/2019   MICROALBUR 1.0 12/12/2019       Assessment & Plan:

## 2019-12-13 ENCOUNTER — Encounter: Payer: Self-pay | Admitting: Internal Medicine

## 2019-12-13 ENCOUNTER — Other Ambulatory Visit: Payer: Self-pay | Admitting: Internal Medicine

## 2019-12-13 LAB — MICROALBUMIN / CREATININE URINE RATIO
Creatinine,U: 147 mg/dL
Microalb Creat Ratio: 0.7 mg/g (ref 0.0–30.0)
Microalb, Ur: 1 mg/dL (ref 0.0–1.9)

## 2019-12-13 MED ORDER — VITAMIN D (ERGOCALCIFEROL) 1.25 MG (50000 UNIT) PO CAPS: 50000.0000 [IU] | ORAL_CAPSULE | ORAL | 0 refills | Status: DC

## 2019-12-15 ENCOUNTER — Encounter: Payer: Self-pay | Admitting: Internal Medicine

## 2019-12-15 NOTE — Assessment & Plan Note (Addendum)
More of a numbness than pain, I suspect somewhat neuritic related to c spine disorder, but for CXR today and  to f/u any worsening symptoms or concerns; very low suspicion for cardiac   I spent 31 minutes in addition to time for CPX wellness examination in preparing to see the patient by review of recent labs, imaging and procedures, obtaining and reviewing separately obtained history, communicating with the patient and family or caregiver, ordering medications, tests or procedures, and documenting clinical information in the EHR including the differential Dx, treatment, and any further evaluation and other management of CP, hyperglycemia, anxiety, htn, hld

## 2019-12-15 NOTE — Assessment & Plan Note (Signed)
Mild situationally worse today, pt is reassured, cont same tx

## 2019-12-15 NOTE — Assessment & Plan Note (Signed)
stable overall by history and exam, recent data reviewed with pt, and pt to continue medical treatment as before,  to f/u any worsening symptoms or concerns  

## 2019-12-15 NOTE — Assessment & Plan Note (Signed)

## 2019-12-15 NOTE — Assessment & Plan Note (Signed)
Lab Results  Component Value Date   LDLCALC 67 12/12/2019  stable overall by history and exam, recent data reviewed with pt, and pt to continue medical treatment as before,  to f/u any worsening symptoms or concerns

## 2019-12-15 NOTE — Assessment & Plan Note (Addendum)
stable overall by history and exam, recent data reviewed with pt, and pt to continue medical treatment as before,  to f/u any worsening symptoms or concerns, refer optho

## 2019-12-16 ENCOUNTER — Telehealth: Payer: Self-pay | Admitting: Internal Medicine

## 2019-12-16 NOTE — Telephone Encounter (Signed)
    Patient calling to clarify GI referral. Referral states "abnormal findings"  Patient also calling for xray results

## 2019-12-17 ENCOUNTER — Encounter: Payer: Self-pay | Admitting: Internal Medicine

## 2019-12-17 NOTE — Telephone Encounter (Signed)
Sent to Dr. Jenny Reichmann to advise and clarify the reasons for the GI referral stating "Abnormal findings".

## 2019-12-17 NOTE — Telephone Encounter (Signed)
An error already fixed.  It is for the screening colonoscopy

## 2019-12-18 NOTE — Telephone Encounter (Signed)
Spoke with pt and informed him of the error that was made. Pt understood and has no questions or concerns at this time.

## 2019-12-25 ENCOUNTER — Encounter: Payer: Self-pay | Admitting: Gastroenterology

## 2019-12-27 ENCOUNTER — Encounter: Payer: 59 | Admitting: Internal Medicine

## 2020-01-06 ENCOUNTER — Other Ambulatory Visit: Payer: Self-pay

## 2020-01-06 ENCOUNTER — Ambulatory Visit (AMBULATORY_SURGERY_CENTER): Payer: Self-pay

## 2020-01-06 VITALS — Ht 68.0 in | Wt 189.0 lb

## 2020-01-06 DIAGNOSIS — Z1211 Encounter for screening for malignant neoplasm of colon: Secondary | ICD-10-CM

## 2020-01-06 MED ORDER — SUTAB 1479-225-188 MG PO TABS
12.0000 | ORAL_TABLET | ORAL | 0 refills | Status: DC
Start: 2020-01-06 — End: 2020-12-15

## 2020-01-06 NOTE — Progress Notes (Signed)
No egg or soy allergy known to patient  No issues with past sedation with any surgeries  or procedures, no intubation problems  No diet pills per patient No home 02 use per patient  No blood thinners per patient  Pt denies issues with constipation  No A fib or A flutter  EMMI video sent to pt's e mail  COVID 19 guidelines implemented in [place] today   sutab code and  Coupon given to pt   Pt has been vaccinated for covid.  Due to the COVID-19 pandemic we are asking patients to follow these guidelines. Please only bring one care partner. Please be aware that your care partner may wait in the car in the parking lot or if they feel like they will be too hot to wait in the car, they may wait in the lobby on the 4th floor. All care partners are required to wear a mask the entire time (we do not have any that we can provide them), they need to practice social distancing, and we will do a Covid check for all patient's and care partners when you arrive. Also we will check their temperature and your temperature. If the care partner waits in their car they need to stay in the parking lot the entire time and we will call them on their cell phone when the patient is ready for discharge so they can bring the car to the front of the building. Also all patient's will need to wear a mask into building.

## 2020-01-07 ENCOUNTER — Encounter: Payer: Self-pay | Admitting: Gastroenterology

## 2020-01-20 ENCOUNTER — Encounter: Payer: Self-pay | Admitting: Gastroenterology

## 2020-01-20 ENCOUNTER — Ambulatory Visit (AMBULATORY_SURGERY_CENTER): Payer: 59 | Admitting: Gastroenterology

## 2020-01-20 ENCOUNTER — Other Ambulatory Visit: Payer: Self-pay

## 2020-01-20 VITALS — BP 105/63 | HR 65 | Temp 96.6°F | Resp 16 | Ht 68.0 in | Wt 189.0 lb

## 2020-01-20 DIAGNOSIS — D122 Benign neoplasm of ascending colon: Secondary | ICD-10-CM

## 2020-01-20 DIAGNOSIS — K6389 Other specified diseases of intestine: Secondary | ICD-10-CM

## 2020-01-20 DIAGNOSIS — Z1211 Encounter for screening for malignant neoplasm of colon: Secondary | ICD-10-CM | POA: Diagnosis present

## 2020-01-20 DIAGNOSIS — K621 Rectal polyp: Secondary | ICD-10-CM | POA: Diagnosis not present

## 2020-01-20 DIAGNOSIS — K635 Polyp of colon: Secondary | ICD-10-CM

## 2020-01-20 DIAGNOSIS — D128 Benign neoplasm of rectum: Secondary | ICD-10-CM

## 2020-01-20 DIAGNOSIS — D129 Benign neoplasm of anus and anal canal: Secondary | ICD-10-CM

## 2020-01-20 MED ORDER — SODIUM CHLORIDE 0.9 % IV SOLN
500.0000 mL | INTRAVENOUS | Status: DC
Start: 2020-01-20 — End: 2020-01-29

## 2020-01-20 NOTE — Patient Instructions (Signed)
Thank you for allowing [place] to care for you today!  Await pathology results of polyps removed, approximately 7-10 days.  Will make recommendations at that time for future colonoscopy.  Resume previous diet and medications today.  Return to your normal activities tomorrow, Tuesday 01/21/20.      YOU HAD AN ENDOSCOPIC PROCEDURE TODAY AT [facility]:   Refer to the procedure report that was given to you for any specific questions about what was found during the examination.  If the procedure report does not answer your questions, please call your gastroenterologist to clarify.  If you requested that your care partner not be given the details of your procedure findings, then the procedure report has been included in a sealed envelope for you to review at your convenience later.  YOU SHOULD EXPECT: Some feelings of bloating in the abdomen. Passage of more gas than usual.  Walking can help get rid of the air that was put into your GI tract during the procedure and reduce the bloating. If you had a lower endoscopy (such as a colonoscopy or flexible sigmoidoscopy) you may notice spotting of blood in your stool or on the toilet paper. If you underwent a bowel prep for your procedure, you may not have a normal bowel movement for a few days.  Please Note:  You might notice some irritation and congestion in your nose or some drainage.  This is from the oxygen used during your procedure.  There is no need for concern and it should clear up in a day or so.  SYMPTOMS TO REPORT IMMEDIATELY:   Following lower endoscopy (colonoscopy or flexible sigmoidoscopy):  Excessive amounts of blood in the stool  Significant tenderness or worsening of abdominal pains  Swelling of the abdomen that is new, acute  Fever of 100F or higher   For urgent or emergent issues, a gastroenterologist can be reached at any hour by calling [phone]. Do not use MyChart messaging for urgent concerns.    DIET:   We do recommend a small meal at first, but then you may proceed to your regular diet.  Drink plenty of fluids but you should avoid alcoholic beverages for 24 hours.  ACTIVITY:  You should plan to take it easy for the rest of today and you should NOT DRIVE or use heavy machinery until tomorrow (because of the sedation medicines used during the test).    FOLLOW UP: Our staff will call the number listed on your records 48-72 hours following your procedure to check on you and address any questions or concerns that you may have regarding the information given to you following your procedure. If we do not reach you, we will leave a message.  We will attempt to reach you two times.  During this call, we will ask if you have developed any symptoms of COVID 19. If you develop any symptoms (ie: fever, flu-like symptoms, shortness of breath, cough etc.) before then, please call [phone].  If you test positive for Covid 19 in the 2 weeks post procedure, please call and report this information to [place].    If any biopsies were taken you will be contacted by phone or by letter within the next 1-3 weeks.  Please call us at [phone] if you have not heard about the biopsies in 3 weeks.    SIGNATURES/CONFIDENTIALITY: You and/or your care partner have signed paperwork which will be entered into your electronic medical record.  These signatures attest to the  fact that that the information above on your After Visit Summary has been reviewed and is understood.  Full responsibility of the confidentiality of this discharge information lies with you and/or your care-partner.

## 2020-01-20 NOTE — Op Note (Signed)
[facility] Patient Name: Peter Schmidt Procedure Date: 01/20/2020 10:34 AM MRN: [id number] Endoscopist: Thornton Park MD, MD Age: 64 Referring MD:  Date of Birth: June 01, 1956 Gender: Male Account #: [id number] Procedure:                Colonoscopy Indications:              Screening for colorectal malignant neoplasm, This                            is the patient's first colonoscopy                           No known family history of colon cancer or polyps Medicines:                Monitored Anesthesia Care Procedure:                Pre-Anesthesia Assessment:                           - Prior to the procedure, a History and Physical                            was performed, and patient medications and                            allergies were reviewed. The patient's tolerance of                            previous anesthesia was also reviewed. The risks                            and benefits of the procedure and the sedation                            options and risks were discussed with the patient.                            All questions were answered, and informed consent                            was obtained. Prior Anticoagulants: The patient has                            taken no previous anticoagulant or antiplatelet                            agents. ASA Grade Assessment: III - A patient with                            severe systemic disease. After reviewing the risks                            and benefits, the patient was deemed in  satisfactory condition to undergo the procedure.                           After obtaining informed consent, the colonoscope                            was passed under direct vision. Throughout the                            procedure, the patient's blood pressure, pulse, and                            oxygen saturations were monitored continuously. The                            Colonoscope was  introduced through the anus and                            advanced to the 3 cm into the ileum. A second                            forward view of the right colon was performed. The                            colonoscopy was performed without difficulty. The                            patient tolerated the procedure well. The quality                            of the bowel preparation was good. The terminal                            ileum, ileocecal valve, appendiceal orifice, and                            rectum were photographed. Scope In: 10:48:26 AM Scope Out: 11:04:41 AM Scope Withdrawal Time: 0 hours 11 minutes 22 seconds  Total Procedure Duration: 0 hours 16 minutes 15 seconds  Findings:                 The perianal and digital rectal examinations were                            normal.                           Non-bleeding internal hemorrhoids were found.                           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                           A 1 mm polyp was found in the rectum. The polyp was  sessile. The polyp was removed with a cold snare.                            Resection and retrieval were complete. Estimated                            blood loss was minimal.                           A 4 mm polyp was found in the ascending colon. The                            polyp was flat. The polyp was removed with a cold                            snare. Resection and retrieval were complete.                            Estimated blood loss was minimal.                           A 1 mm area of umbilicated polypoid mucosa was                            seen. No bleeding was present. This was biopsied                            with a cold forceps for histology. Estimated blood                            loss was minimal.                           The exam was otherwise without abnormality on                            direct and retroflexion  views. Complications:            No immediate complications. Estimated blood loss:                            Minimal. Estimated Blood Loss:     Estimated blood loss was minimal. Impression:               - Non-bleeding internal hemorrhoids.                           - Melanosis in the colon.                           - One 1 mm polyp in the rectum, removed with a cold                            snare. Resected and retrieved.                           -  One 2 mm polyp in the ascending colon, removed                            with a cold snare. Resected and retrieved.                           - Polypoid lesion at the hepatic flexure. Biopsied.                           - The examination was otherwise normal on direct                            and retroflexion views. Recommendation:           - Patient has a contact number available for                            emergencies. The signs and symptoms of potential                            delayed complications were discussed with the                            patient. Return to normal activities tomorrow.                            Written discharge instructions were provided to the                            patient.                           - Resume previous diet.                           - Continue present medications.                           - Await pathology results.                           - Repeat colonoscopy date to be determined after                            pending pathology results are reviewed for                            surveillance.                           - Emerging evidence supports eating a diet of                            fruits, vegetables, grains, calcium, and yogurt  while reducing red meat and alcohol may reduce the                            risk of colon cancer.                           - Thank you for allowing me to be involved in your                            colon  cancer prevention. Thornton Park MD, MD 01/20/2020 11:13:56 AM This report has been signed electronically.

## 2020-01-20 NOTE — Progress Notes (Signed)
pt tolerated well. VSS. awake and to recovery. Report given to RN.  

## 2020-01-20 NOTE — Progress Notes (Signed)
Called to room to assist during endoscopic procedure.  Patient ID and intended procedure confirmed with present staff. Received instructions for my participation in the procedure from the performing physician.  

## 2020-01-20 NOTE — Progress Notes (Signed)
Vs CW I have reviewed the patient's medical history in detail and updated the computerized patient record.   

## 2020-01-22 ENCOUNTER — Telehealth: Payer: Self-pay

## 2020-01-22 NOTE — Telephone Encounter (Signed)
Covid-19 screening questions   Do you now or have you had a fever in the last 14 days? No.  Do you have any respiratory symptoms of shortness of breath or cough now or in the last 14 days? No.  Do you have any family members or close contacts with diagnosed or suspected Covid-19 in the past 14 days? No.  Have you been tested for Covid-19 and found to be positive? No.       Follow up Call-  Call back number 01/20/2020  Post procedure Call Back phone  # [phone]  Permission to leave phone message Yes  Some recent data might be hidden     Patient questions:  Do you have a fever, pain , or abdominal swelling? No. Pain Score  0 *  Have you tolerated food without any problems? Yes.    Have you been able to return to your normal activities? Yes.    Do you have any questions about your discharge instructions: Diet   No. Medications  No. Follow up visit  No.  Do you have questions or concerns about your Care? No.Pt. Peter Schmidt he felt a little "unsettled" yesterday, but thinks he is better today.  Told pt. Should he not improve in how he feels after recovering from the procedure, to call and let us know.  He is eating and able to tolerate food.  Actions: * If pain score is 4 or above: No action needed, pain <4.

## 2020-01-23 ENCOUNTER — Encounter: Payer: Self-pay | Admitting: Gastroenterology

## 2020-01-28 ENCOUNTER — Telehealth: Payer: Self-pay

## 2020-01-28 NOTE — Telephone Encounter (Signed)
New message    1.Medication Requested:HYDROcodone-acetaminophen (NORCO) 7.5-325 MG tablet  2. Pharmacy (Name, Street, [place]):[facility] [phone] - [place], [place] AT [place]  3. On Med List: Yes   4. Last Visit with PCP: 6.17.21   5. Next visit date with PCP: n/a   Agent: Please be advised that RX refills may take up to 3 business days. We ask that you follow-up with your pharmacy.

## 2020-01-29 MED ORDER — HYDROCODONE-ACETAMINOPHEN 7.5-325 MG PO TABS: 1.0000 | ORAL_TABLET | Freq: Every day | ORAL | 0 refills | Status: DC | PRN

## 2020-01-29 NOTE — Telephone Encounter (Signed)
Done erx 

## 2020-01-29 NOTE — Telephone Encounter (Signed)
Sent to Dr. John. 

## 2020-03-03 ENCOUNTER — Telehealth: Payer: Self-pay | Admitting: Internal Medicine

## 2020-03-03 NOTE — Telephone Encounter (Signed)
Sent to Dr. John. 

## 2020-03-03 NOTE — Telephone Encounter (Signed)
HYDROcodone-acetaminophen (NORCO) 7.5-325 MG tablet  [facility] [phone] - [place], [place] AT [place] Phone:  [phone]  Fax:  [phone]     Last appt: 7.2.21

## 2020-03-04 MED ORDER — HYDROCODONE-ACETAMINOPHEN 7.5-325 MG PO TABS: 1.0000 | ORAL_TABLET | Freq: Every day | ORAL | 0 refills | Status: DC | PRN

## 2020-03-04 NOTE — Telephone Encounter (Signed)
Done erx 

## 2020-04-17 ENCOUNTER — Telehealth: Payer: Self-pay | Admitting: Internal Medicine

## 2020-04-17 NOTE — Telephone Encounter (Signed)
HYDROcodone-acetaminophen (NORCO) 7.5-325 MG tablet [facility] [phone] - [place], [place] AT [place] Phone:  [phone]  Fax:  [phone]     Last seen-07.02.21

## 2020-04-20 MED ORDER — HYDROCODONE-ACETAMINOPHEN 7.5-325 MG PO TABS: 1.0000 | ORAL_TABLET | Freq: Every day | ORAL | 0 refills | Status: DC | PRN

## 2020-04-20 NOTE — Telephone Encounter (Signed)
Sent to Dr. John. 

## 2020-04-20 NOTE — Telephone Encounter (Signed)
Done erx 

## 2020-06-16 ENCOUNTER — Telehealth: Payer: Self-pay | Admitting: Internal Medicine

## 2020-06-16 ENCOUNTER — Other Ambulatory Visit: Payer: Self-pay | Admitting: Internal Medicine

## 2020-06-16 MED ORDER — HYDROCODONE-ACETAMINOPHEN 7.5-325 MG PO TABS: 1.0000 | ORAL_TABLET | Freq: Every day | ORAL | 0 refills | Status: DC | PRN

## 2020-06-16 NOTE — Telephone Encounter (Signed)
Ok done erx  [facility] to let pt know, the [facility] policy for narcotic have changed so I will need to call her at some point to explain about signing a [facility] pain contract and having urinary drug screen done  [email or web address] reveiwed, MME 90

## 2020-06-16 NOTE — Telephone Encounter (Signed)
1.Medication Requested: HYDROcodone-acetaminophen (NORCO) 7.5-325 MG tablet    2. Pharmacy (Name, Street, [place]): [phone] - [place], [address] AT [address]   3. On Med List: Yes   4. Last Visit with PCP: 6.17.21  5. Next visit date with PCP: n/a   Patient said that he is completely out of his medication.    Agent: Please be advised that RX refills may take up to 3 business days. We ask that you follow-up with your pharmacy.

## 2020-06-16 NOTE — Telephone Encounter (Signed)
Sent to Dr. John. 

## 2020-08-31 ENCOUNTER — Telehealth: Payer: Self-pay | Admitting: Internal Medicine

## 2020-08-31 DIAGNOSIS — H538 Other visual disturbances: Secondary | ICD-10-CM

## 2020-08-31 MED ORDER — HYDROCODONE-ACETAMINOPHEN 7.5-325 MG PO TABS: 1.0000 | ORAL_TABLET | Freq: Every day | ORAL | 0 refills | Status: DC | PRN

## 2020-08-31 NOTE — Telephone Encounter (Signed)
Patient is requesting a referral to an ophthalmologist. He was referred to one back in June 2021. Please advise

## 2020-08-31 NOTE — Telephone Encounter (Signed)
Ok this is done 

## 2020-08-31 NOTE — Telephone Encounter (Signed)
1.Medication Requested: HYDROcodone-acetaminophen (NORCO) 7.5-325 MG tablet    2. Pharmacy (Name, Street, [place]): [facility] [phone] - [place], [address] AT [address]  3. On Med List: yes   4. Last Visit with PCP: 6.17.21  5. Next visit date with PCP: n/a  Patient said that he is out of the medication    Agent: Please be advised that RX refills may take up to 3 business days. We ask that you follow-up with your pharmacy.

## 2020-09-06 ENCOUNTER — Encounter: Payer: Self-pay | Admitting: Internal Medicine

## 2020-10-01 ENCOUNTER — Other Ambulatory Visit: Payer: Self-pay | Admitting: Internal Medicine

## 2020-10-01 NOTE — Telephone Encounter (Signed)
Please refill as per office routine med refill policy (all routine meds refilled for 3 mo or monthly per pt preference up to one year from last visit, then month to month grace period for 3 mo, then further med refills will have to be denied)  

## 2020-10-22 ENCOUNTER — Telehealth: Payer: Self-pay | Admitting: Internal Medicine

## 2020-10-22 DIAGNOSIS — G8929 Other chronic pain: Secondary | ICD-10-CM

## 2020-10-22 NOTE — Telephone Encounter (Signed)
OK to contact pt  Please remind pt I will no longer be able to prescribe the pain medication after July 1  Does the patient want to be referred to pain management to continue this? We are asking because it takes up to 90 days sometimes to complete the referral process 

## 2020-10-23 MED ORDER — HYDROCODONE-ACETAMINOPHEN 7.5-325 MG PO TABS
1.0000 | ORAL_TABLET | Freq: Every day | ORAL | 0 refills | Status: DC | PRN
Start: 2020-10-23 — End: 2020-11-25

## 2020-10-23 NOTE — Addendum Note (Signed)
Addended by: Biagio Borg on: 10/23/2020 08:54 PM   Modules accepted: Orders

## 2020-10-23 NOTE — Telephone Encounter (Signed)
Refill done    Referral done

## 2020-11-18 ENCOUNTER — Encounter: Payer: Self-pay | Admitting: Physical Medicine & Rehabilitation

## 2020-11-24 ENCOUNTER — Telehealth: Payer: Self-pay | Admitting: Internal Medicine

## 2020-11-24 NOTE — Telephone Encounter (Addendum)
    Patient requesting refill for HYDROcodone-acetaminophen (NORCO) 7.5-325 MG tablet   Pharmacy [facility] [place], [place]

## 2020-11-25 MED ORDER — HYDROCODONE-ACETAMINOPHEN 7.5-325 MG PO TABS: 1.0000 | ORAL_TABLET | Freq: Every day | ORAL | 0 refills | Status: DC | PRN

## 2020-11-25 NOTE — Telephone Encounter (Signed)
Done erx 

## 2020-11-25 NOTE — Telephone Encounter (Signed)
Last Visit: 12/12/19 Next Visit: 12/15/20 Last filled: 10/24/20  PMP done, please advise

## 2020-12-15 ENCOUNTER — Ambulatory Visit (INDEPENDENT_AMBULATORY_CARE_PROVIDER_SITE_OTHER): Payer: 59 | Admitting: Internal Medicine

## 2020-12-15 ENCOUNTER — Other Ambulatory Visit: Payer: Self-pay

## 2020-12-15 ENCOUNTER — Encounter: Payer: Self-pay | Admitting: Internal Medicine

## 2020-12-15 VITALS — BP 146/78 | HR 76 | Temp 98.8°F | Ht 68.0 in | Wt 180.0 lb

## 2020-12-15 DIAGNOSIS — E538 Deficiency of other specified B group vitamins: Secondary | ICD-10-CM

## 2020-12-15 DIAGNOSIS — R7302 Impaired glucose tolerance (oral): Secondary | ICD-10-CM

## 2020-12-15 DIAGNOSIS — Z0001 Encounter for general adult medical examination with abnormal findings: Secondary | ICD-10-CM | POA: Diagnosis not present

## 2020-12-15 DIAGNOSIS — E559 Vitamin D deficiency, unspecified: Secondary | ICD-10-CM | POA: Diagnosis not present

## 2020-12-15 DIAGNOSIS — E7800 Pure hypercholesterolemia, unspecified: Secondary | ICD-10-CM

## 2020-12-15 DIAGNOSIS — I10 Essential (primary) hypertension: Secondary | ICD-10-CM

## 2020-12-15 LAB — CBC WITH DIFFERENTIAL/PLATELET
Basophils Absolute: 0.1 10*3/uL (ref 0.0–0.1)
Basophils Relative: 0.7 % (ref 0.0–3.0)
Eosinophils Absolute: 0.2 10*3/uL (ref 0.0–0.7)
Eosinophils Relative: 3 % (ref 0.0–5.0)
HCT: 37.9 % — ABNORMAL LOW (ref 39.0–52.0)
Hemoglobin: 12.6 g/dL — ABNORMAL LOW (ref 13.0–17.0)
Lymphocytes Relative: 24.2 % (ref 12.0–46.0)
Lymphs Abs: 1.8 10*3/uL (ref 0.7–4.0)
MCHC: 33.4 g/dL (ref 30.0–36.0)
MCV: 81.6 fl (ref 78.0–100.0)
Monocytes Absolute: 0.4 10*3/uL (ref 0.1–1.0)
Monocytes Relative: 5.9 % (ref 3.0–12.0)
Neutro Abs: 4.9 10*3/uL (ref 1.4–7.7)
Neutrophils Relative %: 66.2 % (ref 43.0–77.0)
Platelets: 204 10*3/uL (ref 150.0–400.0)
RBC: 4.64 Mil/uL (ref 4.22–5.81)
RDW: 14.1 % (ref 11.5–15.5)
WBC: 7.4 10*3/uL (ref 4.0–10.5)

## 2020-12-15 LAB — BASIC METABOLIC PANEL
BUN: 7 mg/dL (ref 6–23)
CO2: 32 mEq/L (ref 19–32)
Calcium: 9.7 mg/dL (ref 8.4–10.5)
Chloride: 98 mEq/L (ref 96–112)
Creatinine, Ser: 0.75 mg/dL (ref 0.40–1.50)
GFR: 95.96 mL/min (ref >60.00)
Glucose, Bld: 97 mg/dL (ref 70–99)
Potassium: 4.6 mEq/L (ref 3.5–5.1)
Sodium: 136 mEq/L (ref 135–145)

## 2020-12-15 LAB — URINALYSIS, ROUTINE W REFLEX MICROSCOPIC
Bilirubin Urine: NEGATIVE
Hgb urine dipstick: NEGATIVE
Ketones, ur: NEGATIVE
Leukocytes,Ua: NEGATIVE
Nitrite: NEGATIVE
RBC / HPF: NONE SEEN (ref 0–?)
Specific Gravity, Urine: 1.015 (ref 1.000–1.030)
Total Protein, Urine: NEGATIVE
Urine Glucose: NEGATIVE
Urobilinogen, UA: 0.2 (ref 0.0–1.0)
pH: 7.5 (ref 5.0–8.0)

## 2020-12-15 LAB — HEPATIC FUNCTION PANEL
ALT: 10 U/L (ref 0–53)
AST: 13 U/L (ref 0–37)
Albumin: 4.6 g/dL (ref 3.5–5.2)
Alkaline Phosphatase: 63 U/L (ref 39–117)
Bilirubin, Direct: 0.1 mg/dL (ref 0.0–0.3)
Total Bilirubin: 0.4 mg/dL (ref 0.2–1.2)
Total Protein: 7.1 g/dL (ref 6.0–8.3)

## 2020-12-15 LAB — HEMOGLOBIN A1C: Hgb A1c MFr Bld: 6.2 % (ref 4.6–6.5)

## 2020-12-15 LAB — VITAMIN B12: Vitamin B-12: 177 pg/mL — ABNORMAL LOW (ref 211–911)

## 2020-12-15 LAB — LIPID PANEL
Cholesterol: 179 mg/dL (ref 0–200)
HDL: 78.5 mg/dL (ref >39.00)
LDL Cholesterol: 84 mg/dL (ref 0–99)
NonHDL: 100.62
Total CHOL/HDL Ratio: 2
Triglycerides: 82 mg/dL (ref 0.0–149.0)
VLDL: 16.4 mg/dL (ref 0.0–40.0)

## 2020-12-15 LAB — TSH: TSH: 0.73 u[IU]/mL (ref 0.35–4.50)

## 2020-12-15 LAB — VITAMIN D 25 HYDROXY (VIT D DEFICIENCY, FRACTURES): VITD: 42.38 ng/mL (ref 30.00–100.00)

## 2020-12-15 LAB — PSA: PSA: 0.31 ng/mL (ref 0.10–4.00)

## 2020-12-15 MED ORDER — LOSARTAN POTASSIUM 50 MG PO TABS: 50.0000 mg | ORAL_TABLET | Freq: Every day | ORAL | 3 refills | Status: DC

## 2020-12-15 MED ORDER — ROSUVASTATIN CALCIUM 20 MG PO TABS: ORAL_TABLET | ORAL | 2 refills | Status: DC

## 2020-12-15 MED ORDER — BUPROPION HCL ER (XL) 150 MG PO TB24: 150.0000 mg | ORAL_TABLET | Freq: Every day | ORAL | 3 refills | Status: DC

## 2020-12-15 NOTE — Patient Instructions (Signed)
Pllease continue to monitor your BP at home, with the goal being to be less than 140/90 at the minimum (and < 130/80 would be better)  Please take OTC Vitamin D3 at 2000 units per day, indefinitely, as you do.   Ok to STOP the metformin  Please continue all other medications as before, and refills have been done if requested.  Please have the pharmacy call with any other refills you may need.  Please continue your efforts at being more active, low cholesterol diet, and weight control.  You are otherwise up to date with prevention measures today.  Please keep your appointments with your specialists as you may have planned  Please go to the LAB at the blood drawing area for the tests to be done  You will be contacted by phone if any changes need to be made immediately.  Otherwise, you will receive a letter about your results with an explanation, but please check with MyChart first.  Please remember to sign up for MyChart if you have not done so, as this will be important to you in the future with finding out test results, communicating by private email, and scheduling acute appointments online when needed.  Please make an Appointment to return in 6 months, or sooner if needed, also with Lab Appointment for testing done 3-5 days before at the [place] (so this is for TWO appointments - please see the scheduling desk as you leave)  Due to the ongoing Covid 19 pandemic, our lab now requires an appointment for any labs done at our office.  If you need labs done and do not have an appointment, please call our office ahead of time to schedule before presenting to the lab for your testing.

## 2020-12-15 NOTE — Progress Notes (Signed)
Patient ID: Peter Schmidt, male   DOB: [birth year], 65 y.o.   MRN: [id number]         Chief Complaint:: wellness exam and htn, low vit d and b12, dm, hld       HPI:  Peter Schmidt is a 65 y.o. male here for wellness exam; plans to call for eye exam, decines covid vax and pneuomovax, o/w up to date with preventive referrals and immunizations                        Also Pt denies chest pain, increased sob or doe, wheezing, orthopnea, PND, increased LE swelling, palpitations, dizziness or syncope.   Pt denies polydipsia, polyuria, or new focal neuro s/s.  Taking Vit D. Lost wt with better diet, and more activity.   Pt denies fever, wt loss, night sweats, loss of appetite, or other constitutional symptoms  No other new complaints   BP Readings from Last 3 Encounters:  12/15/20 (!) 146/78  01/20/20 (!) 105/63  12/12/19 (!) 150/58   Immunization History  Administered Date(s) Administered   H1N1 07/24/2008   Influenza Split 03/26/2012   Influenza Whole 04/16/2007, 03/24/2008   Influenza,inj,Quad PF,6+ Mos 05/09/2013, 03/15/2017, 04/09/2018   PFIZER(Purple Top)SARS-COV-2 Vaccination 07/18/2019, 08/08/2019   Pneumococcal Conjugate-13 09/28/2017   Pneumococcal Polysaccharide-23 06/04/2007, 11/22/2018   Td 07/24/2008   Tdap 11/22/2018   Zoster Recombinat (Shingrix) 09/30/2017, 12/09/2017   Health Maintenance Due  Topic Date Due   OPHTHALMOLOGY EXAM  11/25/2017      Past Medical History:  Diagnosis Date   ALLERGIC RHINITIS 05/05/2009   Allergy    seasonal   DIABETES MELLITUS, TYPE II 03/24/2008   12/2019-pt denies DM diagnosis has family hx and borderline A1-C in past, now normal.   HEPATITIS B 06/01/2007   HYPERLIPIDEMIA 07/03/2007   HYPERTENSION 06/04/2007   Impaired glucose tolerance 11/05/2014   RECTAL BLEEDING, HX OF 06/01/2007   SPONDYLOSIS, CERVICAL 06/01/2007   TOBACCO ABUSE 06/01/2007   Past Surgical History:  Procedure Laterality Date   WISDOM TOOTH EXTRACTION      reports that he has  been smoking cigarettes. He has been smoking an average of 0.25 packs per day. He has never used smokeless tobacco. He reports that he does not drink alcohol and does not use drugs. family history includes Alcohol abuse in his father; Breast cancer in his mother; Cancer in his father; Diabetes in his mother; Heart disease in his mother; Prostate cancer in his father; Stroke in his father and mother. No Known Allergies Current Outpatient Medications on File Prior to Visit  Medication Sig Dispense Refill   HYDROcodone-acetaminophen (NORCO) 7.5-325 MG tablet Take 1 tablet by mouth daily as needed for moderate pain. 30 tablet 0   Na Sulfate-K Sulfate-Mg Sulf 17.5-3.13-1.6 GM/177ML SOLN Suprep (no substitutions)-TAKE AS DIRECTED. 354 mL 0   nitroGLYCERIN (NITRODUR - DOSED IN MG/24 HR) 0.2 mg/hr patch 1/4 patch daily 30 patch 1   Omega-3 Fatty Acids (FISH OIL PO) Take 1 tablet by mouth daily.     vardenafil (LEVITRA) 20 MG tablet Take 1 tablet (20 mg total) by mouth daily as needed for erectile dysfunction. 1 by mouth every other day as needed 10 tablet 11   No current facility-administered medications on file prior to visit.        ROS:  All others reviewed and negative.  Objective        PE:  BP (!) 146/78 (BP Location: Left Arm, Patient  Position: Sitting, Cuff Size: Large)   Pulse 76   Temp 98.8 F (37.1 C) (Oral)   Ht 5\' 8"  (1.727 m)   Wt 180 lb (81.6 kg)   SpO2 97%   BMI 27.37 kg/m                 Constitutional: Pt appears in NAD               HENT: Head: NCAT.                Right Ear: External ear normal.                 Left Ear: External ear normal.                Eyes: . Pupils are equal, round, and reactive to light. Conjunctivae and EOM are normal               Nose: without d/c or deformity               Neck: Neck supple. Gross normal ROM               Cardiovascular: Normal rate and regular rhythm.                 Pulmonary/Chest: Effort normal and breath sounds without  rales or wheezing.                Abd:  Soft, NT, ND, + BS, no organomegaly               Neurological: Pt is alert. At baseline orientation, motor grossly intact               Skin: Skin is warm. No rashes, no other new lesions, LE edema - none               Psychiatric: Pt behavior is normal without agitation   Micro: none  Cardiac tracings I have personally interpreted today:  none  Pertinent Radiological findings (summarize): none   Lab Results  Component Value Date   WBC 7.4 12/15/2020   HGB 12.6 (L) 12/15/2020   HCT 37.9 (L) 12/15/2020   PLT 204.0 12/15/2020   GLUCOSE 97 12/15/2020   CHOL 179 12/15/2020   TRIG 82.0 12/15/2020   HDL 78.50 12/15/2020   LDLDIRECT 145.3 07/21/2008   LDLCALC 84 12/15/2020   ALT 10 12/15/2020   AST 13 12/15/2020   NA 136 12/15/2020   K 4.6 12/15/2020   CL 98 12/15/2020   CREATININE 0.75 12/15/2020   BUN 7 12/15/2020   CO2 32 12/15/2020   TSH 0.73 12/15/2020   PSA 0.31 12/15/2020   HGBA1C 6.2 12/15/2020   MICROALBUR 1.0 12/12/2019   Assessment/Plan:  Peter Schmidt is a 65 y.o. Black or African American [2] male with  has a past medical history of ALLERGIC RHINITIS (05/05/2009), Allergy, DIABETES MELLITUS, TYPE II (03/24/2008), HEPATITIS B (06/01/2007), HYPERLIPIDEMIA (07/03/2007), HYPERTENSION (06/04/2007), Impaired glucose tolerance (11/05/2014), RECTAL BLEEDING, HX OF (06/01/2007), SPONDYLOSIS, CERVICAL (06/01/2007), and TOBACCO ABUSE (06/01/2007).  Encounter for well adult exam with abnormal findings Age and sex appropriate education and counseling updated with regular exercise and diet Referrals for preventative services - none needed Immunizations addressed - declines covid vax, pneumovax Smoking counseling  - counseled to quit, pt not ready Evidence for depression or other mood disorder - none significant Most recent labs reviewed. I have personally reviewed and have noted: 1) the patient's medical and  social history 2) The patient's current  medications and supplements 3) The patient's height, weight, and BMI have been recorded in the chart   B12 deficiency Lab Results  Component Value Date   VITAMINB12 177 (L) 12/15/2020   Low to start replacement - b12 1000 mcg qd   Essential hypertension BP Readings from Last 3 Encounters:  12/15/20 (!) 146/78  01/20/20 (!) 105/63  12/12/19 (!) 150/58   Uncontrolled, pt to continue medical treatment losrtan, as pt declines change and to check bp daily at home for 10 days and let us know average   Hyperlipidemia Lab Results  Component Value Date   [place] 84 12/15/2020   Stable, pt to continue current statin crestor, declines increase though goal <  70 ldl  Impaired glucose tolerance Lab Results  Component Value Date   HGBA1C 6.2 12/15/2020   Overcontrolled, ok to d/c metformin as has done very well with wt loss, diet, and activity   Vitamin D deficiency Last vitamin D Lab Results  Component Value Date   VD25OH 42.38 12/15/2020   Stable, cont oral replacement  Followup: Return in about 6 months (around 06/16/2021).  Cathlean Cower, MD 12/17/2020 9:56 PM [facility]

## 2020-12-16 ENCOUNTER — Encounter: Payer: Self-pay | Admitting: Internal Medicine

## 2020-12-17 ENCOUNTER — Encounter: Payer: Self-pay | Admitting: Internal Medicine

## 2020-12-17 DIAGNOSIS — E559 Vitamin D deficiency, unspecified: Secondary | ICD-10-CM | POA: Insufficient documentation

## 2020-12-17 NOTE — Assessment & Plan Note (Addendum)
BP Readings from Last 3 Encounters:  12/15/20 (!) 146/78  01/20/20 (!) 105/63  12/12/19 (!) 150/58   Uncontrolled, pt to continue medical treatment losrtan, as pt declines change and to check bp daily at home for 10 days and let us know average

## 2020-12-17 NOTE — Assessment & Plan Note (Signed)
Lab Results  Component Value Date   VITAMINB12 177 (L) 12/15/2020   Low to start replacement - b12 1000 mcg qd

## 2020-12-17 NOTE — Assessment & Plan Note (Signed)
Last vitamin D Lab Results  Component Value Date   VD25OH 42.38 12/15/2020   Stable, cont oral replacement  

## 2020-12-17 NOTE — Assessment & Plan Note (Signed)
Lab Results  Component Value Date   HGBA1C 6.2 12/15/2020   Overcontrolled, ok to d/c metformin as has done very well with wt loss, diet, and activity

## 2020-12-17 NOTE — Assessment & Plan Note (Signed)
Age and sex appropriate education and counseling updated with regular exercise and diet Referrals for preventative services - none needed Immunizations addressed - declines covid vax, pneumovax Smoking counseling  - counseled to quit, pt not ready Evidence for depression or other mood disorder - none significant Most recent labs reviewed. I have personally reviewed and have noted: 1) the patient's medical and social history 2) The patient's current medications and supplements 3) The patient's height, weight, and BMI have been recorded in the chart

## 2020-12-17 NOTE — Assessment & Plan Note (Signed)
Lab Results  Component Value Date   LDLCALC 84 12/15/2020   Stable, pt to continue current statin crestor, declines increase though goal <  70 ldl

## 2020-12-19 ENCOUNTER — Other Ambulatory Visit: Payer: Self-pay | Admitting: Internal Medicine

## 2020-12-19 NOTE — Telephone Encounter (Signed)
Please refill as per office routine med refill policy (all routine meds refilled for 3 mo or monthly per pt preference up to one year from last visit, then month to month grace period for 3 mo, then further med refills will have to be denied)  

## 2020-12-22 ENCOUNTER — Other Ambulatory Visit: Payer: Self-pay | Admitting: Internal Medicine

## 2020-12-24 ENCOUNTER — Other Ambulatory Visit: Payer: Self-pay

## 2020-12-29 ENCOUNTER — Telehealth: Payer: Self-pay | Admitting: Internal Medicine

## 2020-12-29 NOTE — Telephone Encounter (Signed)
Patient calling to request HYDROcodone-acetaminophen ([place]) 7.5-325 MG tablet  Pharmacy [facility] [place], [place]

## 2021-01-01 MED ORDER — HYDROCODONE-ACETAMINOPHEN 7.5-325 MG PO TABS: 1.0000 | ORAL_TABLET | Freq: Every day | ORAL | 0 refills | Status: DC | PRN

## 2021-01-01 NOTE — Telephone Encounter (Signed)
Ok done

## 2021-01-01 NOTE — Telephone Encounter (Signed)
Last Visit: 12/15/20 Next Visit: 06/17/21 Last Filled: 11/25/20  PMP done; please advise

## 2021-01-01 NOTE — Telephone Encounter (Signed)
Spoke with patient an he states that his appointment with pain management is 01/05/21 but like a refill until then. Please advise

## 2021-01-01 NOTE — Telephone Encounter (Signed)
Patient notified

## 2021-01-01 NOTE — Telephone Encounter (Signed)
Ok to let pt know, that I decline to refill this medication further, as he has been notified about this since at least April 2022, and he was referred to pain management in apr 2022

## 2021-01-07 ENCOUNTER — Encounter: Payer: 59 | Attending: Physical Medicine & Rehabilitation | Admitting: Physical Medicine & Rehabilitation

## 2021-01-07 ENCOUNTER — Other Ambulatory Visit: Payer: Self-pay

## 2021-01-07 ENCOUNTER — Encounter: Payer: Self-pay | Admitting: Physical Medicine & Rehabilitation

## 2021-01-07 VITALS — BP 111/68 | HR 65 | Temp 97.8°F | Ht 68.0 in | Wt 180.2 lb

## 2021-01-07 DIAGNOSIS — M542 Cervicalgia: Secondary | ICD-10-CM | POA: Insufficient documentation

## 2021-01-07 DIAGNOSIS — G8929 Other chronic pain: Secondary | ICD-10-CM | POA: Insufficient documentation

## 2021-01-07 DIAGNOSIS — Z5181 Encounter for therapeutic drug level monitoring: Secondary | ICD-10-CM | POA: Diagnosis not present

## 2021-01-07 NOTE — Progress Notes (Signed)
Subjective:    Patient ID: Peter Schmidt, male    DOB: 1956/05/28, 65 y.o.   MRN: [id number]  HPI Referred by PCP CC:  RIght sided neck pain Constant pain but worsens with stress, and work activity ( sitting at desk using computer) , pain  and numbness in RUE progresses during the day . Takes Hydrocodone 7.5mg  per day  Has tried Tramadol but this makes him nauseated  Pt denies Side effects of medication Has not tried gabapentin Reviewed PDMP- MME ~5.75/day  Opioid risk 0  Pt states that he had PT after RIght rotator cuff repair Pt walks for exercise but not motivated lately Pain Inventory Average Pain 9 Pain Right Now 7 My pain is intermittent, stabbing, and aching  In the last 24 hours, has pain interfered with the following? General activity 9 Relation with others 10 Enjoyment of life 8 What TIME of day is your pain at its worst? morning  and night Sleep (in general) Fair  Pain is worse with: sitting and some activites Pain improves with: rest and medication Relief from Meds: 7  walk without assistance how many minutes can you walk? 30 mins ability to climb steps?  yes do you drive?  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  I need assistance with the following:  none  No problems in this area  N/a  N/a    Family History  Problem Relation Age of Onset   Heart disease Mother    Stroke Mother    Diabetes Mother    Breast cancer Mother    Cancer Father        prostate   Alcohol abuse Father        ETOH   Stroke Father    Prostate cancer Father    Colon cancer Neg Hx    Esophageal cancer Neg Hx    Stomach cancer Neg Hx    Colon polyps Neg Hx    Rectal cancer Neg Hx    Social History   Socioeconomic History   Marital status: Single    Spouse name: Not on file   Number of children: Not on file   Years of education: Not on file   Highest education level: Not on file  Occupational History   Occupation:  alcohol and drug/therapist/psychologist  Tobacco Use   Smoking status: Every Day    Packs/day: 0.25    Types: Cigarettes   Smokeless tobacco: Never   Tobacco comments:    01/06/20-trying to quit  Vaping Use   Vaping Use: Never used  Substance and Sexual Activity   Alcohol use: No   Drug use: No   Sexual activity: Not on file  Other Topics Concern   Not on file  Social History Narrative   Single, homosexual   Social Determinants of Health   Financial Resource Strain: Not on file  Food Insecurity: Not on file  Transportation Needs: Not on file  Physical Activity: Not on file  Stress: Not on file  Social Connections: Not on file   Past Surgical History:  Procedure Laterality Date   WISDOM TOOTH EXTRACTION     Past Medical History:  Diagnosis Date   ALLERGIC RHINITIS 05/05/2009   Allergy    seasonal   DIABETES MELLITUS, TYPE II 03/24/2008   12/2019-pt denies DM diagnosis has family hx and borderline A1-C in past, now normal.   HEPATITIS B 06/01/2007   HYPERLIPIDEMIA 07/03/2007   HYPERTENSION 06/04/2007  Impaired glucose tolerance 11/05/2014   RECTAL BLEEDING, HX OF 06/01/2007   SPONDYLOSIS, CERVICAL 06/01/2007   TOBACCO ABUSE 06/01/2007   BP 111/68 (BP Location: Right Arm, Patient Position: Sitting, Cuff Size: Large)   Pulse 65   Temp 97.8 F (36.6 C) (Oral)   Ht 5\' 8"  (1.727 m)   Wt 180 lb 3.2 oz (81.7 kg)   SpO2 99%   BMI 27.40 kg/m   Opioid Risk Score:   Fall Risk Score:  `1  Depression screen PHQ 2/9  Depression screen [facility] 2/9 12/15/2020 12/15/2020 12/12/2019 12/12/2019 11/22/2018 09/28/2017 11/05/2014  Decreased Interest 0 0 0 0 0 0 0  Down, Depressed, Hopeless 0 0 0 0 0 0 0  PHQ - 2 Score 0 0 0 0 0 0 0     Review of Systems  Constitutional: Negative.   HENT: Negative.    Eyes: Negative.   Respiratory: Negative.    Cardiovascular: Negative.   Endocrine: Negative.   Genitourinary: Negative.   Musculoskeletal:  Positive for back pain and neck pain.       Right  shoulder pain   Skin: Negative.   Allergic/Immunologic: Negative.   Neurological: Negative.   Hematological: Negative.   Psychiatric/Behavioral: Negative.        Objective:   Physical Exam Vitals and nursing note reviewed.  Constitutional:      Appearance: He is normal weight.  Eyes:     Extraocular Movements: Extraocular movements intact.     Conjunctiva/sclera: Conjunctivae normal.     Pupils: Pupils are equal, round, and reactive to light.  Neck:     Comments: Right upper trapezius tenderness to palpation Musculoskeletal:     Cervical back: Normal range of motion. Tenderness present. No torticollis. Muscular tenderness present.  Neurological:     Mental Status: He is alert.  Motor strength is 5/5 bilateral deltoid, bicep, tricep, grip, hip flexor, knee extensor, ankle dorsiflexor and plantar flexor Negative straight leg raising bilaterally Reduced sensation to light touch in the right C5-C6-C7 C8 dermatomal distribution. No evidence of abnormal tone in the upper extremities Gait without evidence of ataxia.  Does not require assistive device No tenderness along the cervical paraspinal area thoracic or lumbar paraspinal area        Assessment & Plan:  1.  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  Patient with sensory but no motor symptoms. Complaints are chronic dating back at least 8 years We discussed trying a medication for the radicular discomfort he states its more a less numbness and not so uncomfortable.  He did not wish to try something like gabapentin. He has been on chronic hydrocodone 7.5 mg/day, he states he takes this later in the day.  It does not interfere with work activities he is not having any constipation or other side effects. Opioid risk tool score is 0 Will check UDS and if consistent i.e. showing hydrocodone but no other illicit or controlled medications would be able to assume prescription. 2.  Myofascial pain  mainly postural periscapular strengthening exercises with Thera-Band were instructed.  If no improvement may consider physical therapy. Follow-up with nurse practitioner 1 month assuming UDS is fine.  Given the very low dose he can be seen on an every 30-month basis

## 2021-01-07 NOTE — Patient Instructions (Signed)
Shoulder Impingement Syndrome Rehab Ask your health care provider which exercises are safe for you. Do exercises exactly as told by your health care provider and adjust them as directed. It is normal to feel mild stretching, pulling, tightness, or discomfort as you do these exercises. Stop right away if you feel sudden pain or your pain gets worse. Do not begin these exercises until told by your health care provider. Stretching and range-of-motion exercise This exercise warms up your muscles and joints and improves the movement and flexibility of your shoulder. This exercise also helps to relieve pain andstiffness. Passive horizontal adduction In passive adduction, you use your other hand to move the injured arm toward your body. The injured arm does not move on its own. In this movement, your arm is moved across your body in the horizontal plane (horizontal adduction). Sit or stand and pull your left / right elbow across your chest, toward your other shoulder. Stop when you feel a gentle stretch in the back of your shoulder and upper arm. Keep your arm at shoulder height. Keep your arm as close to your body as you comfortably can. Hold for __________ seconds. Slowly return to the starting position. Repeat __________ times. Complete this exercise __________ times a day. Strengthening exercises These exercises build strength and endurance in your shoulder. Endurance is theability to use your muscles for a long time, even after they get tired. External rotation, isometric This is an exercise in which you press the back of your wrist against a door frame without moving your shoulder joint (isometric). Stand or sit in a doorway, facing the door frame. Bend your left / right elbow and place the back of your wrist against the door frame. Only the back of your wrist should be touching the frame. Keep your upper arm at your side. Gently press your wrist against the door frame, as if you are trying to push  your arm away from your abdomen (external rotation). Press as hard as you are able without pain. Avoid shrugging your shoulder while you press your wrist against the door frame. Keep your shoulder blade tucked down toward the middle of your back. Hold for __________ seconds. Slowly release the tension, and relax your muscles completely before you repeat the exercise. Repeat __________ times. Complete this exercise __________ times a day. Internal rotation, isometric This is an exercise in which you press your palm against a door frame without moving your shoulder joint (isometric). Stand or sit in a doorway, facing the door frame. Bend your left / right elbow and place the palm of your hand against the door frame. Only your palm should be touching the frame. Keep your upper arm at your side. Gently press your hand against the door frame, as if you are trying to push your arm toward your abdomen (internal rotation). Press as hard as you are able without pain. Avoid shrugging your shoulder while you press your hand against the door frame. Keep your shoulder blade tucked down toward the middle of your back. Hold for __________ seconds. Slowly release the tension, and relax your muscles completely before you repeat the exercise. Repeat __________ times. Complete this exercise __________ times a day. Scapular protraction, supine  Lie on your back on a firm surface (supine position). Hold a __________ weight in your left / right hand. Raise your left / right arm straight into the air so your hand is directly above your shoulder joint. Push the weight into the air so your shoulder (  scapula) lifts off the surface that you are lying on. The scapula will push up or forward (protraction). Do not move your head, neck, or back. Hold for __________ seconds. Slowly return to the starting position. Let your muscles relax completely before you repeat this exercise. Repeat __________ times. Complete this exercise  __________ times a day. Scapular retraction  Sit in a stable chair without armrests, or stand up. Secure an exercise band to a stable object in front of you so the band is at shoulder height. Hold one end of the exercise band in each hand. Your palms should face down. Squeeze your shoulder blades together (retraction) and move your elbows slightly behind you. Do not shrug your shoulders upward while you do this. Hold for __________ seconds. Slowly return to the starting position. Repeat __________ times. Complete this exercise __________ times a day. Shoulder extension  Sit in a stable chair without armrests, or stand up. Secure an exercise band to a stable object in front of you so the band is above shoulder height. Hold one end of the exercise band in each hand. Straighten your elbows and lift your hands up to shoulder height. Squeeze your shoulder blades together and pull your hands down to the sides of your thighs (extension). Stop when your hands are straight down by your sides. Do not let your hands go behind your body. Hold for __________ seconds. Slowly return to the starting position. Repeat __________ times. Complete this exercise __________ times a day. This information is not intended to replace advice given to you by your health care provider. Make sure you discuss any questions you have with your healthcare provider. Document Revised: 10/05/2018 Document Reviewed: 07/09/2018 Elsevier Patient Education  [place].

## 2021-01-11 LAB — TOXASSURE SELECT,+ANTIDEPR,UR

## 2021-01-15 ENCOUNTER — Telehealth: Payer: Self-pay | Admitting: *Deleted

## 2021-01-15 NOTE — Telephone Encounter (Signed)
Urine drug screen is positive for hydrocodone, but positive for methadone and its metabolite as well. According to PMP this has not been prescribed unless he has history of going to the methadone clinic that does not show up on PMP.

## 2021-01-18 NOTE — Telephone Encounter (Signed)
Per Dr Letta Pate: Will not prescribe for this patient.  [facility] appointments .  Appts cancelled and letter mailed informing of discharge.

## 2021-02-04 ENCOUNTER — Ambulatory Visit: Payer: 59 | Admitting: Registered Nurse

## 2021-06-09 ENCOUNTER — Telehealth (INDEPENDENT_AMBULATORY_CARE_PROVIDER_SITE_OTHER): Payer: 59 | Admitting: Internal Medicine

## 2021-06-09 ENCOUNTER — Other Ambulatory Visit: Payer: Self-pay

## 2021-06-09 DIAGNOSIS — E559 Vitamin D deficiency, unspecified: Secondary | ICD-10-CM

## 2021-06-09 DIAGNOSIS — U071 COVID-19: Secondary | ICD-10-CM | POA: Diagnosis not present

## 2021-06-09 DIAGNOSIS — F411 Generalized anxiety disorder: Secondary | ICD-10-CM

## 2021-06-09 DIAGNOSIS — L84 Corns and callosities: Secondary | ICD-10-CM | POA: Diagnosis not present

## 2021-06-09 DIAGNOSIS — R7302 Impaired glucose tolerance (oral): Secondary | ICD-10-CM | POA: Diagnosis not present

## 2021-06-09 MED ORDER — NIRMATRELVIR/RITONAVIR (PAXLOVID)TABLET: 3.0000 | ORAL_TABLET | Freq: Two times a day (BID) | ORAL | 0 refills | Status: AC

## 2021-06-09 NOTE — Progress Notes (Signed)
Patient ID: Peter Schmidt, male   DOB: October 05, 1955, 65 y.o.   MRN: [id number]  Virtual Visit via Video Note  I connected with Peter Schmidt on 06/09/21 at 11:00 AM EST by a video enabled telemedicine application and verified that I am speaking with the correct person using two identifiers.  Location of all participants today Patient: at home Provider: at office   I discussed the limitations of evaluation and management by telemedicine and the availability of in person appointments. The patient expressed understanding and agreed to proceed.  History of Present Illness: Pt here with c/o covid like symptoms starting mon dec 12, and home tested + yesterday.  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  Pt symptoms are fatigue, chills, HA, myalgias, and slight cough as well as transient diarrhea the first day only.  Has covid vax x 2 and booster x 3.  Denies sob, n/v.  No prior hx of covid infection  Denies worsening depressive symptoms, suicidal ideation, or panic Past Medical History:  Diagnosis Date   ALLERGIC RHINITIS 05/05/2009   Allergy    seasonal   DIABETES MELLITUS, TYPE II 03/24/2008   12/2019-pt denies DM diagnosis has family hx and borderline A1-C in past, now normal.   HEPATITIS B 06/01/2007   HYPERLIPIDEMIA 07/03/2007   HYPERTENSION 06/04/2007   Impaired glucose tolerance 11/05/2014   RECTAL BLEEDING, HX OF 06/01/2007   SPONDYLOSIS, CERVICAL 06/01/2007   TOBACCO ABUSE 06/01/2007   Past Surgical History:  Procedure Laterality Date   WISDOM TOOTH EXTRACTION      reports that he has been smoking cigarettes. He has been smoking an average of .25 packs per day. He has never used smokeless tobacco. He reports that he does not drink alcohol and does not use drugs. family history includes Alcohol abuse in his father; Breast cancer in his mother; Cancer in his father; Diabetes in his mother; Heart disease in his mother; Prostate cancer in his father; Stroke  in his father and mother. No Known Allergies Current Outpatient Medications on File Prior to Visit  Medication Sig Dispense Refill   buPROPion (WELLBUTRIN XL) 150 MG 24 hr tablet Take 1 tablet (150 mg total) by mouth daily. 90 tablet 3   HYDROcodone-acetaminophen (NORCO) 7.5-325 MG tablet Take 1 tablet by mouth daily as needed for moderate pain. 30 tablet 0   losartan (COZAAR) 50 MG tablet Take 1 tablet (50 mg total) by mouth daily. 90 tablet 3   Na Sulfate-K Sulfate-Mg Sulf 17.5-3.13-1.6 GM/177ML SOLN Suprep (no substitutions)-TAKE AS DIRECTED. 354 mL 0   nitroGLYCERIN (NITRODUR - DOSED IN MG/24 HR) 0.2 mg/hr patch 1/4 patch daily 30 patch 1   Omega-3 Fatty Acids (FISH OIL PO) Take 1 tablet by mouth daily.     rosuvastatin (CRESTOR) 20 MG tablet TAKE 1 TABLET(20 MG) BY MOUTH DAILY 90 tablet 3   vardenafil (LEVITRA) 20 MG tablet Take 1 tablet (20 mg total) by mouth daily as needed for erectile dysfunction. 1 by mouth every other day as needed 10 tablet 11   No current facility-administered medications on file prior to visit.   Observations/Objective: Alert, NAD, appropriate mood and affect, resps normal, cn 2-12 intact, moves all 4s, no visible rash or swelling Lab Results  Component Value Date   WBC 7.4 12/15/2020   HGB 12.6 (L) 12/15/2020   HCT 37.9 (L) 12/15/2020   PLT 204.0 12/15/2020   GLUCOSE 97 12/15/2020   CHOL 179 12/15/2020  TRIG 82.0 12/15/2020   HDL 78.50 12/15/2020   LDLDIRECT 145.3 07/21/2008   LDLCALC 84 12/15/2020   ALT 10 12/15/2020   AST 13 12/15/2020   NA 136 12/15/2020   K 4.6 12/15/2020   CL 98 12/15/2020   CREATININE 0.75 12/15/2020   BUN 7 12/15/2020   CO2 32 12/15/2020   TSH 0.73 12/15/2020   PSA 0.31 12/15/2020   HGBA1C 6.2 12/15/2020   MICROALBUR 1.0 12/12/2019   Assessment and Plan: See notes  Follow Up Instructions: See notes   I discussed the assessment and treatment plan with the patient. The patient was provided an opportunity to ask  questions and all were answered. The patient agreed with the plan and demonstrated an understanding of the instructions.   The patient was advised to call back or seek an in-person evaluation if the symptoms worsen or if the condition fails to improve as anticipated.  Cathlean Cower, MD

## 2021-06-09 NOTE — Assessment & Plan Note (Signed)
Lab Results  Component Value Date   HGBA1C 6.2 12/15/2020   Stable, pt to continue current medical treatment  - diet

## 2021-06-09 NOTE — Assessment & Plan Note (Signed)
Overall stable, no need for tx change at this time

## 2021-06-09 NOTE — Assessment & Plan Note (Signed)
Pt is african [place] of advanced age and higher risk though milder symptoms only, ok for paxlovid course asd, work note to be mailed,  to f/u any worsening symptoms or concerns

## 2021-06-09 NOTE — Assessment & Plan Note (Signed)
Last vitamin D Lab Results  Component Value Date   VD25OH 42.38 12/15/2020   Stable, cont oral replacement, also consider VIt C and zinc with current covid infection

## 2021-06-09 NOTE — Patient Instructions (Signed)
Please take all new medication as prescribed - the paxlovid  You will be contacted regarding the referral for: podiatry  Please make an Appointment to return in 6 months, or sooner if needed

## 2021-06-09 NOTE — Assessment & Plan Note (Signed)
Also for podiatry referral as per pt request

## 2021-06-17 ENCOUNTER — Ambulatory Visit: Payer: 59 | Admitting: Internal Medicine

## 2021-06-29 ENCOUNTER — Telehealth: Payer: Self-pay

## 2021-07-01 ENCOUNTER — Other Ambulatory Visit: Payer: Self-pay

## 2021-07-01 ENCOUNTER — Ambulatory Visit (INDEPENDENT_AMBULATORY_CARE_PROVIDER_SITE_OTHER): Payer: 59 | Admitting: Podiatry

## 2021-07-01 DIAGNOSIS — B353 Tinea pedis: Secondary | ICD-10-CM

## 2021-07-01 DIAGNOSIS — M79674 Pain in right toe(s): Secondary | ICD-10-CM | POA: Diagnosis not present

## 2021-07-01 DIAGNOSIS — L84 Corns and callosities: Secondary | ICD-10-CM | POA: Diagnosis not present

## 2021-07-01 DIAGNOSIS — B351 Tinea unguium: Secondary | ICD-10-CM | POA: Diagnosis not present

## 2021-07-01 DIAGNOSIS — M7741 Metatarsalgia, right foot: Secondary | ICD-10-CM | POA: Diagnosis not present

## 2021-07-01 DIAGNOSIS — R52 Pain, unspecified: Secondary | ICD-10-CM | POA: Diagnosis not present

## 2021-07-01 DIAGNOSIS — M2042 Other hammer toe(s) (acquired), left foot: Secondary | ICD-10-CM

## 2021-07-01 DIAGNOSIS — M79675 Pain in left toe(s): Secondary | ICD-10-CM

## 2021-07-01 MED ORDER — CICLOPIROX 8 % EX SOLN: Freq: Every day | CUTANEOUS | 0 refills | Status: AC

## 2021-07-01 MED ORDER — KETOCONAZOLE 2 % EX CREA: 1.0000 "application " | TOPICAL_CREAM | Freq: Every day | CUTANEOUS | 2 refills | Status: AC

## 2021-07-01 NOTE — Patient Instructions (Signed)
Look for urea 40% cream or ointment and apply to the thickened dry skin / calluses. This can be bought over the counter, at a pharmacy or online such as [employer].  

## 2021-07-05 NOTE — Progress Notes (Signed)
°  Subjective:  Patient ID: Peter Schmidt, male    DOB: Nov 23, 1955,  MRN: [id number]  Chief Complaint  Patient presents with   Callouses     np-corn 5th L;callus R;toenail fungus-req a tue or thur am appt-dr. Cathlean Cower refer    66 y.o. male presents with the above complaint. History confirmed with patient.  Also has itchy dry skin on both feet and between the toes  Objective:  Physical Exam: warm, good capillary refill, no trophic changes or ulcerative lesions, normal DP and PT pulses, normal sensory exam, and tinea pedis. Left Foot: dystrophic yellowed discolored nail plates with subungual debris and callus dorsal lateral fifth toe PIPJ Right Foot: dystrophic yellowed discolored nail plates with subungual debris and callus submetatarsal 4  Assessment:   1. Pain due to onychomycosis of toenails of both feet   2. Callus of foot   3. Hammertoe of left foot   4. Metatarsalgia of right foot   5. Tinea pedis of both feet      Plan:  Patient was evaluated and treated and all questions answered.  Discussed the etiology and treatment options for the condition in detail with the patient. Educated patient on the topical and oral treatment options for mycotic nails. Recommended debridement of the nails today. Sharp and mechanical debridement performed of all painful and mycotic nails today. Nails debrided in length and thickness using a nail nipper to level of comfort. Discussed treatment options including appropriate shoe gear. Follow up as needed for painful nails.  Rx for ciclopirox sent to pharmacy  Discussed the etiology and treatment options for tinea pedis.  Discussed topical and oral treatment.  Recommended topical treatment with 2% ketoconazole cream.  This was sent to the patient's pharmacy.  Also discussed appropriate foot hygiene, use of antifungal spray such as Tinactin in shoes, as well as cleaning foot surfaces such as showers and bathroom floors with bleach.  All symptomatic  hyperkeratoses were safely debrided with a sterile #15 blade to patient's level of comfort without incident. We discussed preventative and palliative care of these lesions including supportive and accommodative shoegear, padding, prefabricated and custom molded accommodative orthoses, use of a pumice stone and lotions/creams daily.  Recommended urea cream.  Also dispensed dancers pads to offload the areas on the plantar foot and a silicone toe pad for the fifth toe on the left   Return in about 4 months (around 10/29/2021) for follow up after nail fungus treatment.

## 2021-08-12 ENCOUNTER — Ambulatory Visit: Payer: Self-pay

## 2021-11-02 ENCOUNTER — Ambulatory Visit: Payer: 59 | Admitting: Podiatry

## 2022-01-22 IMAGING — DX DG CHEST 2V
2 series · 2 of 2 positions shown · non-contrast
Comparison: Chest radiograph dated 09/28/2017

CLINICAL DATA: 63-year-old male with right-sided chest pain.

EXAM:
CHEST - 2 VIEW

[chest pa]
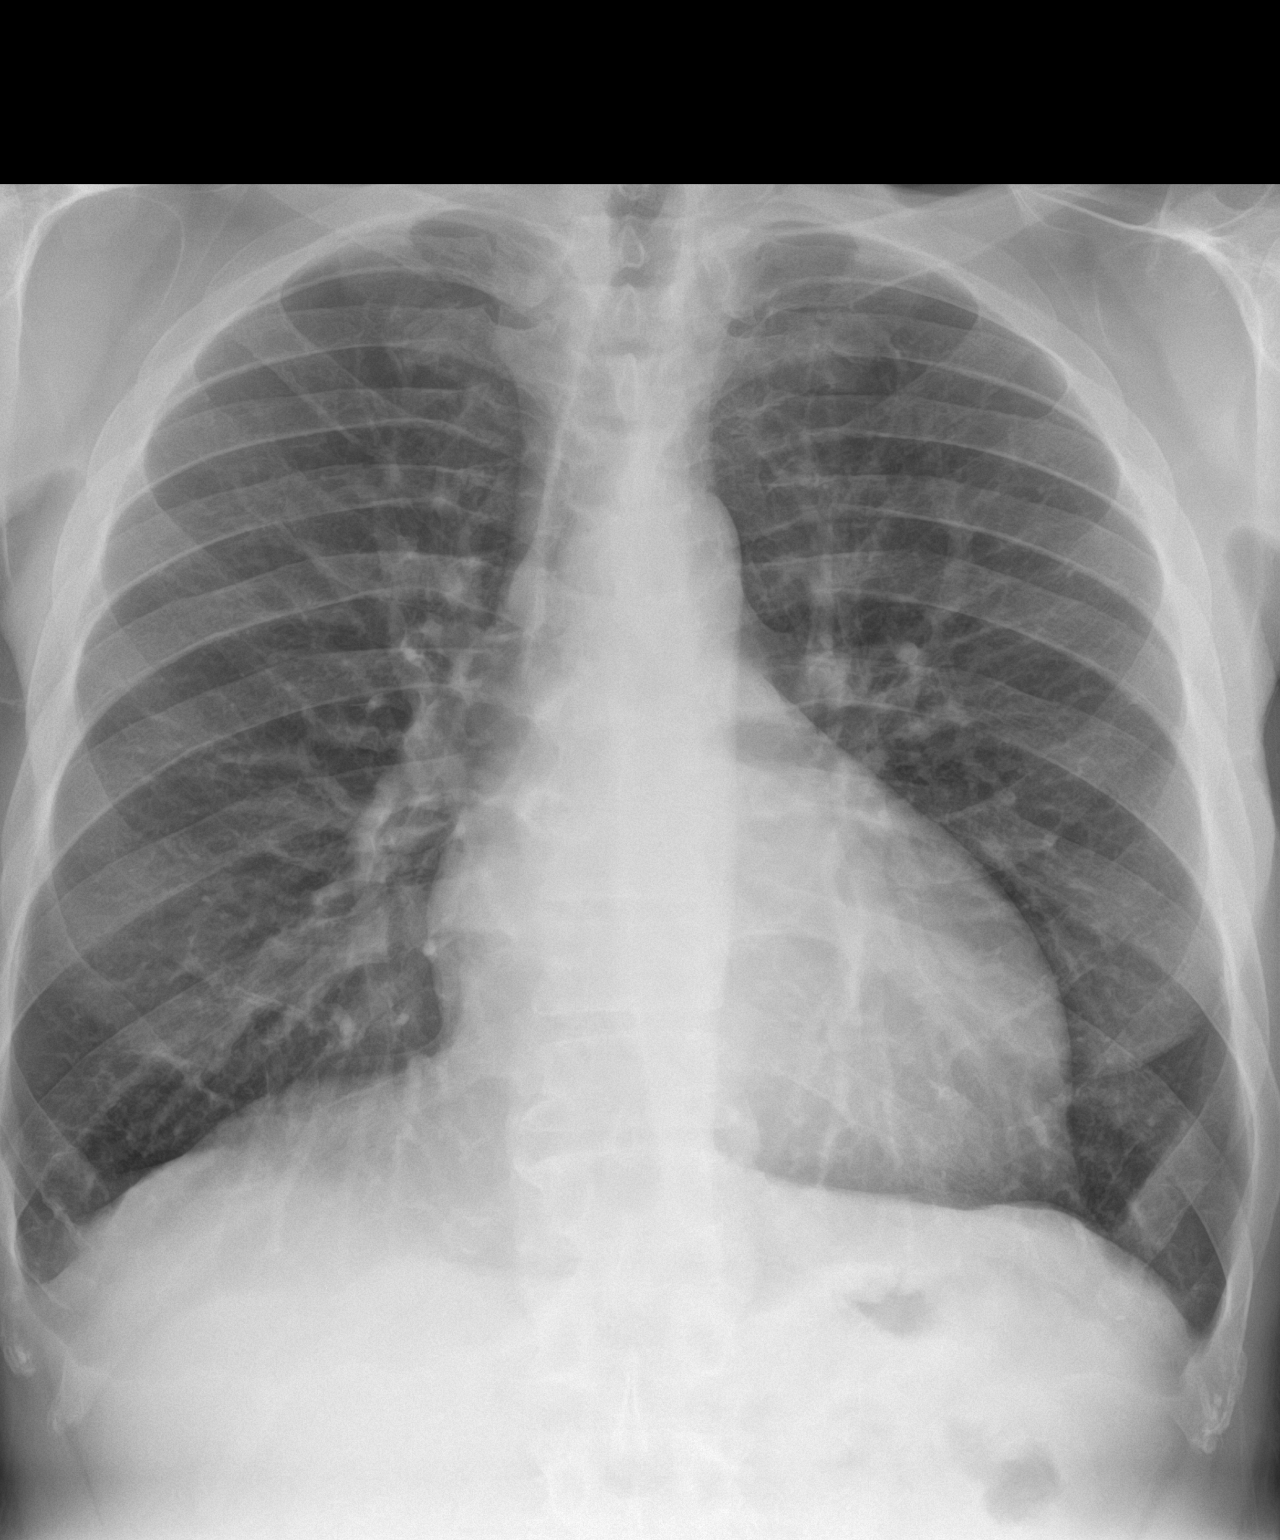

[chest lat]
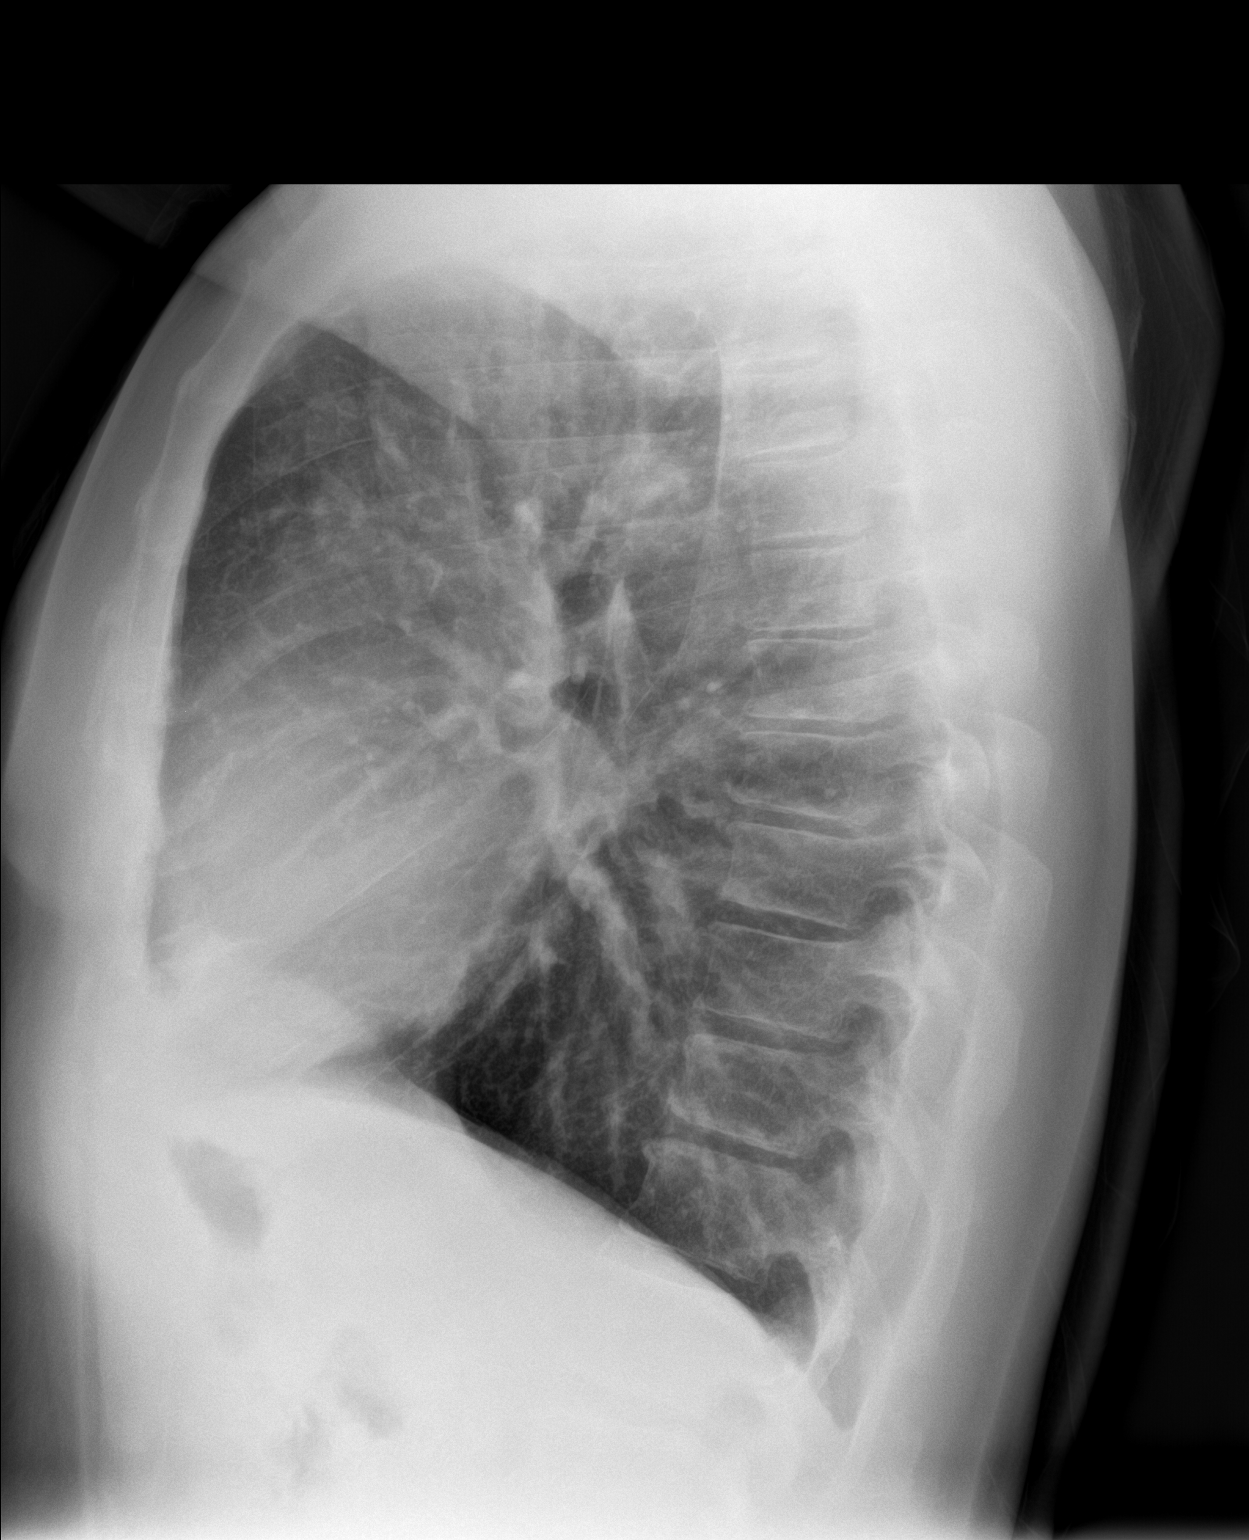

[2 of 2 positions shown; findings below may reference images not displayed]

FINDINGS: No focal consolidation, pleural effusion or pneumothorax. The
cardiac silhouette is within normal limits. No acute osseous
pathology.
IMPRESSION: No active cardiopulmonary disease.

## 2022-03-04 ENCOUNTER — Telehealth: Payer: Self-pay | Admitting: Internal Medicine

## 2022-03-04 NOTE — Telephone Encounter (Signed)
Patient would like his resouvastatin refilled - sent to [facility] on [address].  Patient was very rude and did not want to set up his appointment, but I informed patient he would have to have an appointment to receive his medication.  Had not been seen since 05/2021 - and did not come to his 6 month follow up that was scheduled in 05/2021.  Patient reluctantly agreed to make an appointment for next Thursday, 03/10/2022.

## 2022-03-07 NOTE — Telephone Encounter (Signed)
Unable to fulfill request until seen by provider.

## 2022-03-10 ENCOUNTER — Ambulatory Visit (INDEPENDENT_AMBULATORY_CARE_PROVIDER_SITE_OTHER): Payer: 59 | Admitting: Internal Medicine

## 2022-03-10 VITALS — BP 138/82 | HR 61 | Ht 68.0 in | Wt 184.0 lb

## 2022-03-10 DIAGNOSIS — Z23 Encounter for immunization: Secondary | ICD-10-CM

## 2022-03-10 DIAGNOSIS — R7302 Impaired glucose tolerance (oral): Secondary | ICD-10-CM

## 2022-03-10 DIAGNOSIS — E7800 Pure hypercholesterolemia, unspecified: Secondary | ICD-10-CM | POA: Diagnosis not present

## 2022-03-10 DIAGNOSIS — E559 Vitamin D deficiency, unspecified: Secondary | ICD-10-CM

## 2022-03-10 DIAGNOSIS — Z125 Encounter for screening for malignant neoplasm of prostate: Secondary | ICD-10-CM

## 2022-03-10 DIAGNOSIS — R9431 Abnormal electrocardiogram [ECG] [EKG]: Secondary | ICD-10-CM

## 2022-03-10 DIAGNOSIS — I10 Essential (primary) hypertension: Secondary | ICD-10-CM

## 2022-03-10 DIAGNOSIS — F411 Generalized anxiety disorder: Secondary | ICD-10-CM

## 2022-03-10 DIAGNOSIS — Z0001 Encounter for general adult medical examination with abnormal findings: Secondary | ICD-10-CM

## 2022-03-10 DIAGNOSIS — E538 Deficiency of other specified B group vitamins: Secondary | ICD-10-CM | POA: Diagnosis not present

## 2022-03-10 MED ORDER — LOSARTAN POTASSIUM 50 MG PO TABS: 50.0000 mg | ORAL_TABLET | Freq: Every day | ORAL | 3 refills | Status: DC

## 2022-03-10 MED ORDER — ROSUVASTATIN CALCIUM 20 MG PO TABS: ORAL_TABLET | ORAL | 3 refills | Status: DC

## 2022-03-10 NOTE — Assessment & Plan Note (Signed)
Lab Results  Component Value Date   VITAMINB12 177 (L) 12/15/2020   Slow, reminded to start oral replacement - b12 1000 mcg qd

## 2022-03-10 NOTE — Patient Instructions (Addendum)
Please see Glass blower/designer for any problem you had with making your appt.  You had the flu shot today  You will be contacted regarding the referral for: Cardiac CT score testing  Please continue all other medications as before, and refills have been done if requested  Please have the pharmacy call with any other refills you may need.  Please continue your efforts at being more active, low cholesterol diet, and weight control.  You are otherwise up to date with prevention measures today.  Please keep your appointments with your specialists as you may have planned  Please make an Appointment to return for your 1 year visit, or sooner if needed, with Lab testing by Appointment as well, to be done about 3-5 days before at the [facility] (so this is for TWO appointments - please see the scheduling desk as you leave)

## 2022-03-10 NOTE — Assessment & Plan Note (Signed)
Last vitamin D Lab Results  Component Value Date   VD25OH 42.38 12/15/2020   Stable, cont oral replacement

## 2022-03-10 NOTE — Progress Notes (Unsigned)
Patient ID: Peter Schmidt, male   DOB: 12-28-1955, 66 y.o.   MRN: [id number]         Chief Complaint:: wellness exam and medication renewal  ***       HPI:  Peter Schmidt is a 66 y.o. male here for wellness exam                        Also***   Wt Readings from Last 3 Encounters:  03/10/22 184 lb (83.5 kg)  01/07/21 180 lb 3.2 oz (81.7 kg)  12/15/20 180 lb (81.6 kg)   BP Readings from Last 3 Encounters:  03/10/22 138/82  01/07/21 111/68  12/15/20 (!) 146/78   Immunization History  Administered Date(s) Administered   H1N1 07/24/2008   Influenza Split 03/26/2012   Influenza Whole 04/16/2007, 03/24/2008   Influenza,inj,Quad PF,6+ Mos 05/09/2013, 03/15/2017, 04/09/2018   PFIZER(Purple Top)SARS-COV-2 Vaccination 07/18/2019, 08/08/2019, 04/03/2020, 12/19/2020, 03/29/2021   Pneumococcal Conjugate-13 09/28/2017   Pneumococcal Polysaccharide-23 06/04/2007, 11/22/2018   Td 07/24/2008   Tdap 11/22/2018   Zoster Recombinat (Shingrix) 09/30/2017, 12/09/2017   Health Maintenance Due  Topic Date Due   Diabetic kidney evaluation - Urine ACR  12/11/2020   HEMOGLOBIN A1C  06/16/2021   Diabetic kidney evaluation - GFR measurement  12/15/2021   FOOT EXAM  12/15/2021      Past Medical History:  Diagnosis Date   ALLERGIC RHINITIS 05/05/2009   Allergy    seasonal   DIABETES MELLITUS, TYPE II 03/24/2008   12/2019-pt denies DM diagnosis has family hx and borderline A1-C in past, now normal.   HEPATITIS B 06/01/2007   HYPERLIPIDEMIA 07/03/2007   HYPERTENSION 06/04/2007   Impaired glucose tolerance 11/05/2014   RECTAL BLEEDING, HX OF 06/01/2007   SPONDYLOSIS, CERVICAL 06/01/2007   TOBACCO ABUSE 06/01/2007   Past Surgical History:  Procedure Laterality Date   WISDOM TOOTH EXTRACTION      reports that he has been smoking cigarettes. He has been smoking an average of .25 packs per day. He has never used smokeless tobacco. He reports that he does not drink alcohol and does not use drugs. family  history includes Alcohol abuse in his father; Breast cancer in his mother; Cancer in his father; Diabetes in his mother; Heart disease in his mother; Prostate cancer in his father; Stroke in his father and mother. No Known Allergies Current Outpatient Medications on File Prior to Visit  Medication Sig Dispense Refill   buPROPion (WELLBUTRIN XL) 150 MG 24 hr tablet Take 1 tablet (150 mg total) by mouth daily. 90 tablet 3   ciclopirox (PENLAC) 8 % solution Apply topically at bedtime. Apply over nail and surrounding skin. Apply daily over previous coat. After seven (7) days, may remove with alcohol and continue cycle. 6.6 mL 0   HYDROcodone-acetaminophen (NORCO) 7.5-325 MG tablet Take 1 tablet by mouth daily as needed for moderate pain. 30 tablet 0   ketoconazole (NIZORAL) 2 % cream Apply 1 application topically daily. 60 g 2   losartan (COZAAR) 50 MG tablet Take 1 tablet (50 mg total) by mouth daily. 90 tablet 3   Na Sulfate-K Sulfate-Mg Sulf 17.5-3.13-1.6 GM/177ML SOLN Suprep (no substitutions)-TAKE AS DIRECTED. 354 mL 0   nitroGLYCERIN (NITRODUR - DOSED IN MG/24 HR) 0.2 mg/hr patch 1/4 patch daily 30 patch 1   Omega-3 Fatty Acids (FISH OIL PO) Take 1 tablet by mouth daily.     rosuvastatin (CRESTOR) 20 MG tablet TAKE 1 TABLET(20 MG) BY MOUTH DAILY 90  tablet 3   vardenafil (LEVITRA) 20 MG tablet Take 1 tablet (20 mg total) by mouth daily as needed for erectile dysfunction. 1 by mouth every other day as needed 10 tablet 11   No current facility-administered medications on file prior to visit.        ROS:  All others reviewed and negative.  Objective        PE:  BP 138/82 (BP Location: Left Arm, Patient Position: Sitting, Cuff Size: Large)   Pulse 61   Ht '5\' 8"'$  (1.727 m)   Wt 184 lb (83.5 kg)   SpO2 98%   BMI 27.98 kg/m                 Constitutional: Pt appears in NAD               HENT: Head: NCAT.                Right Ear: External ear normal.                 Left Ear: External ear  normal.                Eyes: . Pupils are equal, round, and reactive to light. Conjunctivae and EOM are normal               Nose: without d/c or deformity               Neck: Neck supple. Gross normal ROM               Cardiovascular: Normal rate and regular rhythm.                 Pulmonary/Chest: Effort normal and breath sounds without rales or wheezing.                Abd:  Soft, NT, ND, + BS, no organomegaly               Neurological: Pt is alert. At baseline orientation, motor grossly intact               Skin: Skin is warm. No rashes, no other new lesions, LE edema - ***               Psychiatric: Pt behavior is normal without agitation   Micro: none  Cardiac tracings I have personally interpreted today:  none  Pertinent Radiological findings (summarize): none   Lab Results  Component Value Date   WBC 7.4 12/15/2020   HGB 12.6 (L) 12/15/2020   HCT 37.9 (L) 12/15/2020   PLT 204.0 12/15/2020   GLUCOSE 97 12/15/2020   CHOL 179 12/15/2020   TRIG 82.0 12/15/2020   HDL 78.50 12/15/2020   LDLDIRECT 145.3 07/21/2008   LDLCALC 84 12/15/2020   ALT 10 12/15/2020   AST 13 12/15/2020   NA 136 12/15/2020   K 4.6 12/15/2020   CL 98 12/15/2020   CREATININE 0.75 12/15/2020   BUN 7 12/15/2020   CO2 32 12/15/2020   TSH 0.73 12/15/2020   PSA 0.31 12/15/2020   HGBA1C 6.2 12/15/2020   MICROALBUR 1.0 12/12/2019   Assessment/Plan:  Peter Schmidt is a 66 y.o. Black or African American [2] male with  has a past medical history of ALLERGIC RHINITIS (05/05/2009), Allergy, DIABETES MELLITUS, TYPE II (03/24/2008), HEPATITIS B (06/01/2007), HYPERLIPIDEMIA (07/03/2007), HYPERTENSION (06/04/2007), Impaired glucose tolerance (11/05/2014), RECTAL BLEEDING, HX OF (06/01/2007), SPONDYLOSIS, CERVICAL (06/01/2007), and TOBACCO ABUSE (06/01/2007).  No problem-specific  Assessment & Plan notes found for this encounter.  Followup: No follow-ups on file.  Cathlean Cower, MD 03/10/2022 10:24 AM [facility]

## 2022-03-13 ENCOUNTER — Encounter: Payer: Self-pay | Admitting: Internal Medicine

## 2022-03-13 NOTE — Assessment & Plan Note (Signed)
Lab Results  Component Value Date   LDLCALC 84 12/15/2020   Stable, pt to continue current statin crestor 20 mg; current goal for ldl < 70, pt also for Cardiac cT score

## 2022-03-13 NOTE — Assessment & Plan Note (Signed)
Age and sex appropriate education and counseling updated with regular exercise and diet Referrals for preventative services - plans to call for optho Immunizations addressed - declines covid booster, for flu shot Smoking counseling  - pt counsled to quit, pt not ready Evidence for depression or other mood disorder - none significant Most recent labs reviewed. I have personally reviewed and have noted: 1) the patient's medical and social history 2) The patient's current medications and supplements 3) The patient's height, weight, and BMI have been recorded in the chart

## 2022-03-13 NOTE — Assessment & Plan Note (Addendum)
Stable overall, cont wellbutrin 150 mg qd

## 2022-03-13 NOTE — Assessment & Plan Note (Signed)
BP Readings from Last 3 Encounters:  03/10/22 138/82  01/07/21 111/68  12/15/20 (!) 146/78   Stable, pt to continue medical treatment losartan 50 mg qd

## 2022-03-13 NOTE — Assessment & Plan Note (Signed)
Lab Results  Component Value Date   HGBA1C 6.2 12/15/2020   Stable, pt to continue current medical treatment - diet, wt control, excercise

## 2022-03-24 ENCOUNTER — Ambulatory Visit (HOSPITAL_COMMUNITY): Payer: 59

## 2023-03-09 ENCOUNTER — Other Ambulatory Visit: Payer: Self-pay

## 2023-03-09 ENCOUNTER — Other Ambulatory Visit: Payer: Self-pay | Admitting: Internal Medicine

## 2023-04-02 ENCOUNTER — Other Ambulatory Visit: Payer: Self-pay | Admitting: Internal Medicine

## 2023-04-03 ENCOUNTER — Other Ambulatory Visit: Payer: Self-pay

## 2023-08-15 ENCOUNTER — Encounter: Payer: Self-pay | Admitting: Internal Medicine

## 2023-08-15 ENCOUNTER — Ambulatory Visit (INDEPENDENT_AMBULATORY_CARE_PROVIDER_SITE_OTHER): Payer: BC Managed Care – PPO | Admitting: Internal Medicine

## 2023-08-15 VITALS — BP 140/78 | HR 65 | Temp 98.4°F | Ht 68.0 in | Wt 186.0 lb

## 2023-08-15 DIAGNOSIS — E538 Deficiency of other specified B group vitamins: Secondary | ICD-10-CM

## 2023-08-15 DIAGNOSIS — R7302 Impaired glucose tolerance (oral): Secondary | ICD-10-CM | POA: Diagnosis not present

## 2023-08-15 DIAGNOSIS — R9431 Abnormal electrocardiogram [ECG] [EKG]: Secondary | ICD-10-CM

## 2023-08-15 DIAGNOSIS — E7800 Pure hypercholesterolemia, unspecified: Secondary | ICD-10-CM | POA: Diagnosis not present

## 2023-08-15 DIAGNOSIS — E559 Vitamin D deficiency, unspecified: Secondary | ICD-10-CM

## 2023-08-15 DIAGNOSIS — Z125 Encounter for screening for malignant neoplasm of prostate: Secondary | ICD-10-CM | POA: Diagnosis not present

## 2023-08-15 DIAGNOSIS — I10 Essential (primary) hypertension: Secondary | ICD-10-CM

## 2023-08-15 DIAGNOSIS — Z0001 Encounter for general adult medical examination with abnormal findings: Secondary | ICD-10-CM

## 2023-08-15 DIAGNOSIS — Z0000 Encounter for general adult medical examination without abnormal findings: Secondary | ICD-10-CM

## 2023-08-15 DIAGNOSIS — F17200 Nicotine dependence, unspecified, uncomplicated: Secondary | ICD-10-CM

## 2023-08-15 LAB — MICROALBUMIN / CREATININE URINE RATIO
Creatinine,U: 168.9 mg/dL
Microalb Creat Ratio: 5.9 mg/g (ref 0.0–30.0)
Microalb, Ur: 1 mg/dL (ref 0.0–1.9)

## 2023-08-15 LAB — HEPATIC FUNCTION PANEL
ALT: 12 U/L (ref 0–53)
AST: 20 U/L (ref 0–37)
Albumin: 4.5 g/dL (ref 3.5–5.2)
Alkaline Phosphatase: 74 U/L (ref 39–117)
Bilirubin, Direct: 0.1 mg/dL (ref 0.0–0.3)
Total Bilirubin: 0.6 mg/dL (ref 0.2–1.2)
Total Protein: 7.6 g/dL (ref 6.0–8.3)

## 2023-08-15 LAB — CBC WITH DIFFERENTIAL/PLATELET
Basophils Absolute: 0.1 10*3/uL (ref 0.0–0.1)
Basophils Relative: 0.9 % (ref 0.0–3.0)
Eosinophils Absolute: 0.4 10*3/uL (ref 0.0–0.7)
Eosinophils Relative: 4.8 % (ref 0.0–5.0)
HCT: 41.7 % (ref 39.0–52.0)
Hemoglobin: 13.6 g/dL (ref 13.0–17.0)
Lymphocytes Relative: 24.5 % (ref 12.0–46.0)
Lymphs Abs: 1.8 10*3/uL (ref 0.7–4.0)
MCHC: 32.7 g/dL (ref 30.0–36.0)
MCV: 82 fL (ref 78.0–100.0)
Monocytes Absolute: 0.5 10*3/uL (ref 0.1–1.0)
Monocytes Relative: 7 % (ref 3.0–12.0)
Neutro Abs: 4.7 10*3/uL (ref 1.4–7.7)
Neutrophils Relative %: 62.8 % (ref 43.0–77.0)
Platelets: 228 10*3/uL (ref 150.0–400.0)
RBC: 5.09 Mil/uL (ref 4.22–5.81)
RDW: 15.6 % — ABNORMAL HIGH (ref 11.5–15.5)
WBC: 7.5 10*3/uL (ref 4.0–10.5)

## 2023-08-15 LAB — URINALYSIS, ROUTINE W REFLEX MICROSCOPIC
Bilirubin Urine: NEGATIVE
Hgb urine dipstick: NEGATIVE
Ketones, ur: NEGATIVE
Leukocytes,Ua: NEGATIVE
Nitrite: NEGATIVE
RBC / HPF: NONE SEEN (ref 0–?)
Specific Gravity, Urine: 1.02 (ref 1.000–1.030)
Total Protein, Urine: NEGATIVE
Urine Glucose: NEGATIVE
Urobilinogen, UA: 1 (ref 0.0–1.0)
pH: 7.5 (ref 5.0–8.0)

## 2023-08-15 LAB — VITAMIN D 25 HYDROXY (VIT D DEFICIENCY, FRACTURES): VITD: 22.9 ng/mL — ABNORMAL LOW (ref 30.00–100.00)

## 2023-08-15 LAB — BASIC METABOLIC PANEL
BUN: 11 mg/dL (ref 6–23)
CO2: 29 meq/L (ref 19–32)
Calcium: 9.3 mg/dL (ref 8.4–10.5)
Chloride: 101 meq/L (ref 96–112)
Creatinine, Ser: 0.61 mg/dL (ref 0.40–1.50)
GFR: 100.24 mL/min (ref >60.00)
Glucose, Bld: 97 mg/dL (ref 70–99)
Potassium: 5 meq/L (ref 3.5–5.1)
Sodium: 138 meq/L (ref 135–145)

## 2023-08-15 LAB — LIPID PANEL
Cholesterol: 168 mg/dL (ref 0–200)
HDL: 96.6 mg/dL (ref >39.00)
LDL Cholesterol: 61 mg/dL (ref 0–99)
NonHDL: 70.95
Total CHOL/HDL Ratio: 2
Triglycerides: 49 mg/dL (ref 0.0–149.0)
VLDL: 9.8 mg/dL (ref 0.0–40.0)

## 2023-08-15 LAB — VITAMIN B12: Vitamin B-12: 212 pg/mL (ref 211–911)

## 2023-08-15 LAB — TSH: TSH: 0.47 u[IU]/mL (ref 0.35–5.50)

## 2023-08-15 LAB — HEMOGLOBIN A1C: Hgb A1c MFr Bld: 6 % (ref 4.6–6.5)

## 2023-08-15 LAB — PSA: PSA: 0.53 ng/mL (ref 0.10–4.00)

## 2023-08-15 MED ORDER — LOSARTAN POTASSIUM 50 MG PO TABS: 50.0000 mg | ORAL_TABLET | Freq: Every day | ORAL | 3 refills | Status: DC

## 2023-08-15 MED ORDER — VARDENAFIL HCL 20 MG PO TABS: 20.0000 mg | ORAL_TABLET | Freq: Every day | ORAL | 11 refills | Status: DC | PRN

## 2023-08-15 MED ORDER — BUPROPION HCL ER (XL) 150 MG PO TB24: 150.0000 mg | ORAL_TABLET | Freq: Every day | ORAL | 3 refills | Status: AC

## 2023-08-15 MED ORDER — ROSUVASTATIN CALCIUM 20 MG PO TABS: ORAL_TABLET | ORAL | 3 refills | Status: AC

## 2023-08-15 NOTE — Progress Notes (Signed)
 The test results show that your current treatment is OK, as the tests are stable.  Please continue the same plan.  There is no other need for change of treatment or further evaluation based on these results, at this time.  thanks

## 2023-08-15 NOTE — Progress Notes (Signed)
 Patient ID: Rashied Corallo, male   DOB: March 22, 1956, 68 y.o.   MRN: [id number]         Chief Complaint:: wellness exam and Annual Exam (Wants a referral to urology for prostate exam )  , low b12, low vit d, hyperglycemia, hld, htn       HPI:  Yani Lal is a 68 y.o. 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   Pt denies polydipsia, polyuria, or new focal neuro s/s.    Pt denies fever, wt loss, night sweats, loss of appetite, or other constitutional symptoms  Willing for Card Ct score  Peak wt has been 274, now down with better diet and activity.   Wt Readings from Last 3 Encounters:  08/15/23 186 lb (84.4 kg)  03/10/22 184 lb (83.5 kg)  01/07/21 180 lb 3.2 oz (81.7 kg)   BP Readings from Last 3 Encounters:  08/15/23 (!) 140/78  03/10/22 138/82  01/07/21 111/68   Immunization History  Administered Date(s) Administered   Fluad Quad(high Dose 65+) 05/12/2023   H1N1 07/24/2008   Influenza Split 03/26/2012   Influenza Whole 04/16/2007, 03/24/2008   Influenza,inj,Quad PF,6+ Mos 05/09/2013, 03/15/2017, 04/09/2018, 03/10/2022   PFIZER(Purple Top)SARS-COV-2 Vaccination 07/18/2019, 08/08/2019, 04/03/2020, 12/19/2020, 03/29/2021   Pneumococcal Conjugate-13 09/28/2017   Pneumococcal Polysaccharide-23 06/04/2007, 11/22/2018   Td 07/24/2008   Tdap 11/22/2018   Zoster Recombinant(Shingrix) 09/30/2017, 12/09/2017   Health Maintenance Due  Topic Date Due   OPHTHALMOLOGY EXAM  11/25/2017      Past Medical History:  Diagnosis Date   ALLERGIC RHINITIS 05/05/2009   Allergy    seasonal   DIABETES MELLITUS, TYPE II 03/24/2008   12/2019-pt denies DM diagnosis has family hx and borderline A1-C in  past, now normal.   HEPATITIS B 06/01/2007   HYPERLIPIDEMIA 07/03/2007   HYPERTENSION 06/04/2007   Impaired glucose tolerance 11/05/2014   RECTAL BLEEDING, HX OF 06/01/2007   SPONDYLOSIS, CERVICAL 06/01/2007   TOBACCO ABUSE 06/01/2007   Past Surgical History:  Procedure Laterality Date   WISDOM TOOTH EXTRACTION      reports that he has been smoking cigarettes. He has never used smokeless tobacco. He reports that he does not drink alcohol and does not use drugs. family history includes Alcohol abuse in his father; Breast cancer in his mother; Cancer in his father; Diabetes in his mother; Heart disease in his mother; Prostate cancer in his father; Stroke in his father and mother. No Known Allergies Current Outpatient Medications on File Prior to Visit  Medication Sig Dispense Refill   ciclopirox (PENLAC) 8 % solution Apply topically at bedtime. Apply over nail and surrounding skin. Apply daily over previous coat. After seven (7) days, may remove with alcohol and continue cycle. 6.6 mL 0   ketoconazole (NIZORAL) 2 % cream Apply 1 application topically daily. 60 g 2   Na Sulfate-K Sulfate-Mg Sulf 17.5-3.13-1.6 GM/177ML SOLN Suprep (no substitutions)-TAKE AS DIRECTED. 354 mL 0   No current facility-administered medications on file prior to visit.        ROS:  All others reviewed and negative.  Objective  PE:  BP (!) 140/78 (BP Location: Left Arm, Patient Position: Sitting, Cuff Size: Normal)   Pulse 65   Temp 98.4 F (36.9 C) (Oral)   Ht 5\' 8"  (1.727 m)   Wt 186 lb (84.4 kg)   SpO2 99%   BMI 28.28 kg/m                 Constitutional: Pt appears in NAD               HENT: Head: NCAT.                Right Ear: External ear normal.                 Left Ear: External ear normal.                Eyes: . Pupils are equal, round, and reactive to light. Conjunctivae and EOM are normal               Nose: without d/c or deformity               Neck: Neck supple. Gross normal ROM                Cardiovascular: Normal rate and regular rhythm.                 Pulmonary/Chest: Effort normal and breath sounds without rales or wheezing.                Abd:  Soft, NT, ND, + BS, no organomegaly               Neurological: Pt is alert. At baseline orientation, motor grossly intact               Skin: Skin is warm. No rashes, no other new lesions, LE edema - none               Psychiatric: Pt behavior is normal without agitation   Micro: none  Cardiac tracings I have personally interpreted today:  none  Pertinent Radiological findings (summarize): none   Lab Results  Component Value Date   WBC 7.5 08/15/2023   HGB 13.6 08/15/2023   HCT 41.7 08/15/2023   PLT 228.0 08/15/2023   GLUCOSE 97 08/15/2023   CHOL 168 08/15/2023   TRIG 49.0 08/15/2023   HDL 96.60 08/15/2023   LDLDIRECT 145.3 07/21/2008   LDLCALC 61 08/15/2023   ALT 12 08/15/2023   AST 20 08/15/2023   NA 138 08/15/2023   K 5.0 08/15/2023   CL 101 08/15/2023   CREATININE 0.61 08/15/2023   BUN 11 08/15/2023   CO2 29 08/15/2023   TSH 0.47 08/15/2023   PSA 0.53 08/15/2023   HGBA1C 6.0 08/15/2023   MICROALBUR 1.0 08/15/2023   Assessment/Plan:  Offie Waide is a 68 y.o. Black or African American [2] male with  has a past medical history of ALLERGIC RHINITIS (05/05/2009), Allergy, DIABETES MELLITUS, TYPE II (03/24/2008), HEPATITIS B (06/01/2007), HYPERLIPIDEMIA (07/03/2007), HYPERTENSION (06/04/2007), Impaired glucose tolerance (11/05/2014), RECTAL BLEEDING, HX OF (06/01/2007), SPONDYLOSIS, CERVICAL (06/01/2007), and TOBACCO ABUSE (06/01/2007).  Encounter for well adult exam with abnormal findings Age and sex appropriate education and counseling updated with regular exercise and diet Referrals for preventative services - pt will call for eye exam Immunizations addressed - declines covid booster Smoking counseling  - pt counsled to quit, pt not ready Evidence for depression or other mood disorder - none significant Most recent  labs  reviewed. I have personally reviewed and have noted: 1) the patient's medical and social history 2) The patient's current medications and supplements 3) The patient's height, weight, and BMI have been recorded in the chart   Vitamin D deficiency Last vitamin D Lab Results  Component Value Date   VD25OH 22.90 (L) 08/15/2023   Low, to start oral replacement   Impaired glucose tolerance Lab Results  Component Value Date   HGBA1C 6.0 08/15/2023   Stable, pt to continue current medical treatment  - diet, wt control   Smoker Pt counsled to quit, pt not ready  Hyperlipidemia Lab Results  Component Value Date   LDLCALC 61 08/15/2023   Stable, pt to continue current statin crestor 20 every day, also for cardiac CT score   Essential hypertension BP Readings from Last 3 Encounters:  08/15/23 (!) 140/78  03/10/22 138/82  01/07/21 111/68   Mild uncontrolled, pt to continue medical treatment losartan 50 every day and f/u bp at home and next visit, declines change for now   B12 deficiency Lab Results  Component Value Date   VITAMINB12 212 08/15/2023   Low, to start oral replacement - b12 1000 mcg qd  Followup: Return in about 6 months (around 02/12/2024).  Oliver Barre, MD 08/19/2023 8:42 PM [facility]

## 2023-08-15 NOTE — Patient Instructions (Addendum)
Please take OTC B12 1000 mcg per day for at least 6 months  Please remember to call for your yearly Eye Exam  Please continue all other medications as before, and refills have been done if requested.  Please have the pharmacy call with any other refills you may need.  Please continue your efforts at being more active, low cholesterol diet, and weight control.  You are otherwise up to date with prevention measures today.  Please keep your appointments with your specialists as you may have planned  You will be contacted regarding the referral for: Cardiac CT score  Please go to the LAB at the blood drawing area for the tests to be done  You will be contacted by phone if any changes need to be made immediately.  Otherwise, you will receive a letter about your results with an explanation, but please check with MyChart first.  Please make an Appointment to return in 6 months, or sooner if needed, also with Lab Appointment for testing done 3-5 days before at the FIRST FLOOR Lab (so this is for TWO appointments - please see the scheduling desk as you leave)

## 2023-08-19 ENCOUNTER — Encounter: Payer: Self-pay | Admitting: Internal Medicine

## 2023-08-19 NOTE — Assessment & Plan Note (Signed)
 Lab Results  Component Value Date   HGBA1C 6.0 08/15/2023   Stable, pt to continue current medical treatment  - diet, wt control

## 2023-08-19 NOTE — Assessment & Plan Note (Addendum)
 Age and sex appropriate education and counseling updated with regular exercise and diet Referrals for preventative services - pt will call for eye exam Immunizations addressed - declines covid booster Smoking counseling  - pt counsled to quit, pt not ready Evidence for depression or other mood disorder - none significant Most recent labs reviewed. I have personally reviewed and have noted: 1) the patient's medical and social history 2) The patient's current medications and supplements 3) The patient's height, weight, and BMI have been recorded in the chart

## 2023-08-19 NOTE — Assessment & Plan Note (Signed)
 Last vitamin D Lab Results  Component Value Date   VD25OH 22.90 (L) 08/15/2023   Low, to start oral replacement

## 2023-08-19 NOTE — Assessment & Plan Note (Signed)
 Lab Results  Component Value Date   VITAMINB12 212 08/15/2023   Low, to start oral replacement - b12 1000 mcg qd

## 2023-08-19 NOTE — Assessment & Plan Note (Signed)
 Pt counsled to quit, pt not ready

## 2023-08-19 NOTE — Assessment & Plan Note (Addendum)
 Lab Results  Component Value Date   LDLCALC 61 08/15/2023   Stable, pt to continue current statin crestor 20 every day, also for cardiac CT score

## 2023-08-19 NOTE — Assessment & Plan Note (Signed)
 BP Readings from Last 3 Encounters:  08/15/23 (!) 140/78  03/10/22 138/82  01/07/21 111/68   Mild uncontrolled, pt to continue medical treatment losartan 50 every day and f/u bp at home and next visit, declines change for now

## 2023-08-25 ENCOUNTER — Telehealth: Payer: Self-pay | Admitting: Internal Medicine

## 2023-08-25 NOTE — Telephone Encounter (Signed)
 Copied from CRM [id number]. Topic: Clinical - Medication Question >> Aug 25, 2023  3:25 PM Alvino Blood C wrote: Reason for CRM: Patient states he was seen by the provider on 08/15/2023 and the provider prescribed the following medications:  vardenafil (LEVITRA) 20 MG tablet buPROPion (WELLBUTRIN XL) 150 MG 24 hr tablet Patient states he didn't discuss taking those medications with the provider. He would like for a nurse to call him back and provide some clarification.   Call back #  [phone] (H)

## 2023-08-28 NOTE — Telephone Encounter (Signed)
 Very sorry, when he said "all refills" I just refilled everything.   He can hold on these if he feels not needed     thanks

## 2023-08-29 ENCOUNTER — Other Ambulatory Visit (HOSPITAL_COMMUNITY): Payer: BC Managed Care – PPO

## 2023-08-29 NOTE — Telephone Encounter (Signed)
 Copied from CRM [id number]. Topic: Clinical - Prescription Issue >> Aug 29, 2023  9:09 AM Lennart Pall wrote: Reason for CRM: Patient was sent in medication on Wellbutrin and Levitra and he is NOT taking those medications. They were never discussed in the visit. Patient did PAY and PICK UP the medications thinking they were part of what he needed to take. Please reach out to patient as soon as possible.

## 2023-08-29 NOTE — Telephone Encounter (Signed)
 Called and left voice mail

## 2023-11-16 LAB — AMB RESULTS CONSOLE CBG: Glucose: 65

## 2023-11-16 NOTE — Progress Notes (Signed)
 Patient came to mobile screening at [facility]. Blood pressure levels were elevated. Non fasting glucose 65. We discussed maintaining an active healthy lifestyle. Pt declined SDOH

## 2023-11-21 ENCOUNTER — Ambulatory Visit: Payer: Self-pay | Admitting: *Deleted

## 2023-11-21 NOTE — Telephone Encounter (Signed)
 Copied from CRM [id number]. Topic: Clinical - Red Word Triage >> Nov 21, 2023  1:07 PM Jim Motts C wrote: Red Word that prompted transfer to Nurse Triage: Elevated Blood Pressure/Headache Reason for Disposition  Systolic BP  >= 160 OR Diastolic >= 100  Answer Assessment - Initial Assessment Questions 1. BLOOD PRESSURE: "What is the blood pressure?" "Did you take at least two measurements 5 minutes apart?"     My BP is 156/76 now.    180/80 something over the weekend.  This was my highest reading.    Normally runs 120/60-70.   I'm taking losartan .   I'm having headaches over the weekend and last week.   I seldom have headaches. 2. ONSET: "When did you take your blood pressure?"     Over the weekend I monitored it.     3. HOW: "How did you take your blood pressure?" (e.g., automatic home BP monitor, visiting nurse)     [facility] was doing BP screening and my BP was elevated. 4. HISTORY: "Do you have a history of high blood pressure?"     Yes 5. MEDICINES: "Are you taking any medicines for blood pressure?" "Have you missed any doses recently?"     Losartan  I took an extra losartan  and it brought my BP down a little. This morning 132/60 first thing this morning.    6. OTHER SYMPTOMS: "Do you have any symptoms?" (e.g., blurred vision, chest pain, difficulty breathing, headache, weakness)     Headaches.    No dizziness or blurry vision 7. PREGNANCY: "Is there any chance you are pregnant?" "When was your last menstrual period?"     N/A  Protocols used: Blood Pressure - High-A-AH  Chief Complaint: Elevated BP and headaches Symptoms: Headaches and elevated BP over the last week.   I monitored it over the weekend and it's been elevated. Frequency: Went to a [place] BP screening last week and it was elevated.  I did not contribute these symptoms to my BP being high.  I have gained about 15 lbs and I've been eating pork chops and bacon a lot lately so I bet that's why.   Pertinent Negatives: Patient  denies dizziness or blurry vision. Disposition: [] ED /[] Urgent Care (no appt availability in office) / [x] Appointment(In office/virtual)/ []  [facility]/ [] Home Care/ [] Refused Recommended Disposition /[] [facility]/ []  Follow-up with PCP Additional Notes: Appt made with Dr Autry Legions for 11/22/2023 at 3:40.

## 2023-11-22 ENCOUNTER — Ambulatory Visit (INDEPENDENT_AMBULATORY_CARE_PROVIDER_SITE_OTHER): Payer: Self-pay | Admitting: Internal Medicine

## 2023-11-22 VITALS — BP 160/72 | HR 60 | Temp 98.6°F | Ht 68.0 in | Wt 204.0 lb

## 2023-11-22 DIAGNOSIS — E559 Vitamin D deficiency, unspecified: Secondary | ICD-10-CM | POA: Diagnosis not present

## 2023-11-22 DIAGNOSIS — I10 Essential (primary) hypertension: Secondary | ICD-10-CM | POA: Diagnosis not present

## 2023-11-22 DIAGNOSIS — E538 Deficiency of other specified B group vitamins: Secondary | ICD-10-CM

## 2023-11-22 DIAGNOSIS — R7302 Impaired glucose tolerance (oral): Secondary | ICD-10-CM | POA: Diagnosis not present

## 2023-11-22 MED ORDER — LOSARTAN POTASSIUM 100 MG PO TABS
100.0000 mg | ORAL_TABLET | Freq: Every day | ORAL | 3 refills | Status: AC
Start: 2023-11-22 — End: ?

## 2023-11-22 NOTE — Assessment & Plan Note (Signed)
 BP Readings from Last 3 Encounters:  11/22/23 (!) 160/72  11/16/23 (!) 146/69  08/15/23 (!) 140/78   Worsening uncontrolled, ok for increased losartan  100 mg every day, , pt to continue monitor BP at home and call in 1-2 wks if still elevated to consider add amlodipine 5

## 2023-11-22 NOTE — Patient Instructions (Addendum)
 Ok to increase the losartan  to 100 mg per day  Please call in 1 - 2 weeks if the BP is still > 140/90 as you may need amlodipine 5 mg added  Please take OTC Vitamin D3 at 2000 units per day, indefinitely  Please continue all other medications as before, and refills have been done if requested.  Please have the pharmacy call with any other refills you may need.  Please keep your appointments with your specialists as you may have planned  Please make an Appointment to return in 6 months, or sooner if needed  Good Luck with your new Job!

## 2023-11-22 NOTE — Progress Notes (Signed)
 Patient ID: Peter Schmidt, male   DOB: 07/17/1955, 68 y.o.   MRN: [id number]        Chief Complaint: follow up htn with wt gain, low vit d and b12, hyperglycemia       HPI:  Peter Schmidt is a 68 y.o. male here overall doing ok, but Not working x 3 mo by choice but plans to get back to work, has accepted a new job. Sitting more, less active, and diet more with bacon and higher fat foods.  Gained almost 20 lbs.  BP has been higher at home as well.  Plans to try be more active soon.  Has HA some mornings, not sure about daytime fatigue.  BP has been up to 190, averaging more 150 sbp.  Pt denies chest pain, increased sob or doe, wheezing, orthopnea, PND, increased LE swelling, palpitations, dizziness or syncope.   Pt denies polydipsia, polyuria, or new focal neuro s/s.   Wt Readings from Last 3 Encounters:  11/22/23 204 lb (92.5 kg)  08/15/23 186 lb (84.4 kg)  03/10/22 184 lb (83.5 kg)   BP Readings from Last 3 Encounters:  11/22/23 (!) 160/72  11/16/23 (!) 146/69  08/15/23 (!) 140/78         Past Medical History:  Diagnosis Date   ALLERGIC RHINITIS 05/05/2009   Allergy    seasonal   DIABETES MELLITUS, TYPE II 03/24/2008   12/2019-pt denies DM diagnosis has family hx and borderline A1-C in past, now normal.   HEPATITIS B 06/01/2007   HYPERLIPIDEMIA 07/03/2007   HYPERTENSION 06/04/2007   Impaired glucose tolerance 11/05/2014   RECTAL BLEEDING, HX OF 06/01/2007   SPONDYLOSIS, CERVICAL 06/01/2007   TOBACCO ABUSE 06/01/2007   Past Surgical History:  Procedure Laterality Date   WISDOM TOOTH EXTRACTION      reports that he has been smoking cigarettes. He has never used smokeless tobacco. He reports that he does not drink alcohol and does not use drugs. family history includes Alcohol abuse in his father; Breast cancer in his mother; Cancer in his father; Diabetes in his mother; Heart disease in his mother; Prostate cancer in his father; Stroke in his father and mother. No Known Allergies Current  Outpatient Medications on File Prior to Visit  Medication Sig Dispense Refill   rosuvastatin  (CRESTOR ) 20 MG tablet TAKE 1 TABLET BY MOUTH EVERY DAY 90 tablet 3   buPROPion  (WELLBUTRIN  XL) 150 MG 24 hr tablet Take 1 tablet (150 mg total) by mouth daily. (Patient not taking: Reported on 11/22/2023) 90 tablet 3   ciclopirox  (PENLAC ) 8 % solution Apply topically at bedtime. Apply over nail and surrounding skin. Apply daily over previous coat. After seven (7) days, may remove with alcohol and continue cycle. (Patient not taking: Reported on 11/22/2023) 6.6 mL 0   ketoconazole  (NIZORAL ) 2 % cream Apply 1 application topically daily. (Patient not taking: Reported on 11/22/2023) 60 g 2   Na Sulfate-K Sulfate-Mg Sulf 17.5-3.13-1.6 GM/177ML SOLN Suprep (no substitutions)-TAKE AS DIRECTED. (Patient not taking: Reported on 11/22/2023) 354 mL 0   No current facility-administered medications on file prior to visit.        ROS:  All others reviewed and negative.  Objective        PE:  BP (!) 160/72 (BP Location: Right Arm, Patient Position: Sitting, Cuff Size: Normal)   Pulse 60   Temp 98.6 F (37 C) (Oral)   Ht 5\' 8"  (1.727 m)   Wt 204 lb (92.5 kg)   SpO2  100%   BMI 31.02 kg/m                 Constitutional: Pt appears in NAD               HENT: Head: NCAT.                Right Ear: External ear normal.                 Left Ear: External ear normal.                Eyes: . Pupils are equal, round, and reactive to light. Conjunctivae and EOM are normal               Nose: without d/c or deformity               Neck: Neck supple. Gross normal ROM               Cardiovascular: Normal rate and regular rhythm.                 Pulmonary/Chest: Effort normal and breath sounds without rales or wheezing.                Abd:  Soft, NT, ND, + BS, no organomegaly               Neurological: Pt is alert. At baseline orientation, motor grossly intact               Skin: Skin is warm. No rashes, no other new  lesions, LE edema - none               Psychiatric: Pt behavior is normal without agitation   Micro: none  Cardiac tracings I have personally interpreted today:  none  Pertinent Radiological findings (summarize): none   Lab Results  Component Value Date   WBC 7.5 08/15/2023   HGB 13.6 08/15/2023   HCT 41.7 08/15/2023   PLT 228.0 08/15/2023   GLUCOSE 97 08/15/2023   CHOL 168 08/15/2023   TRIG 49.0 08/15/2023   HDL 96.60 08/15/2023   LDLDIRECT 145.3 07/21/2008   LDLCALC 61 08/15/2023   ALT 12 08/15/2023   AST 20 08/15/2023   NA 138 08/15/2023   K 5.0 08/15/2023   CL 101 08/15/2023   CREATININE 0.61 08/15/2023   BUN 11 08/15/2023   CO2 29 08/15/2023   TSH 0.47 08/15/2023   PSA 0.53 08/15/2023   HGBA1C 6.0 08/15/2023   MICROALBUR 1.0 08/15/2023   Assessment/Plan:  [name] is a 68 y.o. Black or African American [2] male with  has a past medical history of ALLERGIC RHINITIS (05/05/2009), Allergy, DIABETES MELLITUS, TYPE II (03/24/2008), HEPATITIS B (06/01/2007), HYPERLIPIDEMIA (07/03/2007), HYPERTENSION (06/04/2007), Impaired glucose tolerance (11/05/2014), RECTAL BLEEDING, HX OF (06/01/2007), SPONDYLOSIS, CERVICAL (06/01/2007), and TOBACCO ABUSE (06/01/2007).  Essential hypertension BP Readings from Last 3 Encounters:  11/22/23 (!) 160/72  11/16/23 (!) 146/69  08/15/23 (!) 140/78   Worsening uncontrolled, ok for increased losartan  100 mg every day, , pt to continue monitor BP at home and call in 1-2 wks if still elevated to consider add amlodipine 5    Impaired glucose tolerance Lab Results  Component Value Date   HGBA1C 6.0 08/15/2023   Stable, pt to continue current medical treatment  - diet,wt control   B12 deficiency Lab Results  Component Value Date   VITAMINB12 212 08/15/2023   Low, to start oral replacement - b12  1000 mcg qd   Vitamin D  deficiency Last vitamin D  Lab Results  Component Value Date   VD25OH 22.90 (L) 08/15/2023   Low, to start oral  replacement  Followup: Return in about 6 months (around 05/24/2024).  Rosalia Colonel, MD 11/22/2023 7:42 PM [facility]

## 2023-11-22 NOTE — Assessment & Plan Note (Signed)
 Lab Results  Component Value Date   VITAMINB12 212 08/15/2023   Low, to start oral replacement - b12 1000 mcg qd

## 2023-11-22 NOTE — Assessment & Plan Note (Signed)
 Last vitamin D Lab Results  Component Value Date   VD25OH 22.90 (L) 08/15/2023   Low, to start oral replacement

## 2023-11-22 NOTE — Assessment & Plan Note (Signed)
 Lab Results  Component Value Date   HGBA1C 6.0 08/15/2023   Stable, pt to continue current medical treatment  - diet, wt control

## 2024-02-05 NOTE — Progress Notes (Signed)
 The patient attended a screening event on 11/16/2023 where his BP screening results was 146/69, non-fasting blood glucose 65. At the event the patient did not document insurance coverage and pt does smoke. Patient declined having any SDOH needs. Pt listed pcp as Dr. Lynwood LELON Rush, MD. At the event clinician discussed with pt maintaining an active healthy lifestyle and pt did not have drowsiness or any other symptoms. Per chart review pt has a pcp and the last office visit was 11/22/2023 for hypertension. The pt BP was 160/72 on 11/22/2023. According to chart review pt was instructed by pcp to continue monitoring BP at home and call in 1-2 wks if BP is still elevated on 11/22/2023. Chart review indicates pt is currently on losartan  to manage BP. Chart review also indicates that pt is to schedule an appointment to return in 6 months or sooner if needed. Chart review shows that pt insurance is [employer]. Abnormal courtesy letter sent with blood pressure and how to quit tobacco resources in case needed by pt.  No additional Health equity team support indicated at this time.

## 2024-06-24 ENCOUNTER — Telehealth: Payer: Self-pay

## 2024-06-24 ENCOUNTER — Ambulatory Visit: Admitting: Internal Medicine

## 2024-06-24 ENCOUNTER — Ambulatory Visit: Payer: Self-pay | Admitting: *Deleted

## 2024-06-24 ENCOUNTER — Encounter: Payer: Self-pay | Admitting: Internal Medicine

## 2024-06-24 VITALS — BP 148/88 | HR 65 | Temp 98.8°F | Ht 68.0 in | Wt 214.0 lb

## 2024-06-24 DIAGNOSIS — H5711 Ocular pain, right eye: Secondary | ICD-10-CM | POA: Insufficient documentation

## 2024-06-24 DIAGNOSIS — H538 Other visual disturbances: Secondary | ICD-10-CM | POA: Diagnosis not present

## 2024-06-24 DIAGNOSIS — I10 Essential (primary) hypertension: Secondary | ICD-10-CM | POA: Diagnosis not present

## 2024-06-24 DIAGNOSIS — R1011 Right upper quadrant pain: Secondary | ICD-10-CM | POA: Insufficient documentation

## 2024-06-24 DIAGNOSIS — F17200 Nicotine dependence, unspecified, uncomplicated: Secondary | ICD-10-CM | POA: Diagnosis not present

## 2024-06-24 MED ORDER — CEFDINIR 300 MG PO CAPS: 300.0000 mg | ORAL_CAPSULE | Freq: Two times a day (BID) | ORAL | 0 refills | Status: AC

## 2024-06-24 NOTE — Telephone Encounter (Signed)
Pt has been seen in office today. 

## 2024-06-24 NOTE — Assessment & Plan Note (Signed)
 Suggestive of retinal problem - for urgent optho referral

## 2024-06-24 NOTE — Telephone Encounter (Signed)
 Copied from CRM #[id number]. Topic: Clinical - Red Word Triage >> Jun 24, 2024  1:57 PM Peter Schmidt wrote: Pt is having blurred vision and pain in his right eye and it's getting worse.

## 2024-06-24 NOTE — Assessment & Plan Note (Signed)
 Etiology unclear, for ruq ultrasound, declines lab today

## 2024-06-24 NOTE — Assessment & Plan Note (Signed)
 Etiology unclear but has inflammation of the right eye mild - for omnicef  course, declines CT for now

## 2024-06-24 NOTE — Assessment & Plan Note (Signed)
 Mild uncontrolled, pt states controlled at home, cont current tx, to f/u any worsening symptoms or concerns  pt to continue medical treatment losartan  100 mg qd

## 2024-06-24 NOTE — Progress Notes (Signed)
 Patient ID: Peter Schmidt, male   DOB: [birth year], 68 y.o.   MRN: [id number]        Chief Complaint: follow up right eye pain and blurred vision, smoker, ruq pain,, htn, smoker       HPI:  Peter Schmidt is a 68 y.o. male here with 1 wk gradually worsening right eye pain, as well as lines in the vision.  Still smoking, not ready to quit.  Also 1-2 mo gradually worsening mild to mod intermittent RUQ pain, Pt denies chest pain, increased sob or doe, wheezing, orthopnea, PND, increased LE swelling, palpitations, dizziness or syncope.   Pt denies polydipsia, polyuria, or new focal neuro s/s.   BP has been better controlled at home.         Wt Readings from Last 3 Encounters:  06/24/24 214 lb (97.1 kg)  11/22/23 204 lb (92.5 kg)  08/15/23 186 lb (84.4 kg)   BP Readings from Last 3 Encounters:  06/24/24 (!) 148/88  11/22/23 (!) 160/72  11/16/23 (!) 146/69         Past Medical History:  Diagnosis Date   ALLERGIC RHINITIS 05/05/2009   Allergy    seasonal   DIABETES MELLITUS, TYPE II 03/24/2008   12/2019-pt denies DM diagnosis has family hx and borderline A1-C in past, now normal.   HEPATITIS B 06/01/2007   HYPERLIPIDEMIA 07/03/2007   HYPERTENSION 06/04/2007   Impaired glucose tolerance 11/05/2014   RECTAL BLEEDING, HX OF 06/01/2007   SPONDYLOSIS, CERVICAL 06/01/2007   TOBACCO ABUSE 06/01/2007   Past Surgical History:  Procedure Laterality Date   WISDOM TOOTH EXTRACTION      reports that he has been smoking cigarettes. He has never used smokeless tobacco. He reports that he does not drink alcohol and does not use drugs. family history includes Alcohol abuse in his father; Breast cancer in his mother; Cancer in his father; Diabetes in his mother; Heart disease in his mother; Prostate cancer in his father; Stroke in his father and mother. Allergies[1] Medications Ordered Prior to Encounter[2]      ROS:  All others reviewed and negative.  Objective        PE:  BP (!) 148/88 (BP Location: Right Arm,  Patient Position: Sitting, Cuff Size: Normal)   Pulse 65   Temp 98.8 F (37.1 C) (Oral)   Ht 5' 8 (1.727 m)   Wt 214 lb (97.1 kg)   SpO2 100%   BMI 32.54 kg/m                 Constitutional: Pt appears in NAD               HENT: Head: NCAT.                Right Ear: External ear normal.                 Left Ear: External ear normal.                Eyes: . Pupils are equal, round, and reactive to light. Conjunctivae with faint right erythema, possible mild swelling and EOM are normal               Nose: without d/c or deformity               Neck: Neck supple. Gross normal ROM               Cardiovascular: Normal rate and regular rhythm.  Pulmonary/Chest: Effort normal and breath sounds without rales or wheezing.                Abd:  Soft, NT, ND, + BS, no organomegaly               Neurological: Pt is alert. At baseline orientation, motor grossly intact               Skin: Skin is warm. No rashes, no other new lesions, LE edema - none               Psychiatric: Pt behavior is normal without agitation   Micro: none  Cardiac tracings I have personally interpreted today:  none  Pertinent Radiological findings (summarize): none   Lab Results  Component Value Date   WBC 7.5 08/15/2023   HGB 13.6 08/15/2023   HCT 41.7 08/15/2023   PLT 228.0 08/15/2023   GLUCOSE 97 08/15/2023   CHOL 168 08/15/2023   TRIG 49.0 08/15/2023   HDL 96.60 08/15/2023   LDLDIRECT 145.3 07/21/2008   LDLCALC 61 08/15/2023   ALT 12 08/15/2023   AST 20 08/15/2023   NA 138 08/15/2023   K 5.0 08/15/2023   CL 101 08/15/2023   CREATININE 0.61 08/15/2023   BUN 11 08/15/2023   CO2 29 08/15/2023   TSH 0.47 08/15/2023   PSA 0.53 08/15/2023   HGBA1C 6.0 08/15/2023   MICROALBUR 1.0 08/15/2023   Assessment/Plan:  Peter Schmidt is a 68 y.o. Black or African American [2] male with  has a past medical history of ALLERGIC RHINITIS (05/05/2009), Allergy, DIABETES MELLITUS, TYPE II (03/24/2008),  HEPATITIS B (06/01/2007), HYPERLIPIDEMIA (07/03/2007), HYPERTENSION (06/04/2007), Impaired glucose tolerance (11/05/2014), RECTAL BLEEDING, HX OF (06/01/2007), SPONDYLOSIS, CERVICAL (06/01/2007), and TOBACCO ABUSE (06/01/2007).  Smoker Pt counsled to quit, pt not ready  Essential hypertension Mild uncontrolled, pt states controlled at home, cont current tx, to f/u any worsening symptoms or concerns  pt to continue medical treatment losartan  100 mg qd   RUQ pain Etiology unclear, for ruq ultrasound, declines lab today  Eye pain, right Etiology unclear but has inflammation of the right eye mild - for omnicef  course, declines CT for now  Blurred vision, right eye Suggestive of retinal problem - for urgent optho referral  Followup: Return if symptoms worsen or fail to improve.  Lynwood Rush, MD 06/24/2024 7:36 PM [facility]  Primary Care - [facility]     [1] No Known Allergies [2]  Current Outpatient Medications on File Prior to Visit  Medication Sig Dispense Refill   losartan  (COZAAR ) 100 MG tablet Take 1 tablet (100 mg total) by mouth daily. 90 tablet 3   rosuvastatin  (CRESTOR ) 20 MG tablet TAKE 1 TABLET BY MOUTH EVERY DAY 90 tablet 3   buPROPion  (WELLBUTRIN  XL) 150 MG 24 hr tablet Take 1 tablet (150 mg total) by mouth daily. (Patient not taking: Reported on 11/22/2023) 90 tablet 3   ciclopirox  (PENLAC ) 8 % solution Apply topically at bedtime. Apply over nail and surrounding skin. Apply daily over previous coat. After seven (7) days, may remove with alcohol and continue cycle. (Patient not taking: Reported on 11/22/2023) 6.6 mL 0   ketoconazole  (NIZORAL ) 2 % cream Apply 1 application topically daily. (Patient not taking: Reported on 11/22/2023) 60 g 2   Na Sulfate-K Sulfate-Mg Sulf 17.5-3.13-1.6 GM/177ML SOLN Suprep (no substitutions)-TAKE AS DIRECTED. (Patient not taking: Reported on 06/24/2024) 354 mL 0   No current facility-administered  medications on file  prior to visit.

## 2024-06-24 NOTE — Telephone Encounter (Signed)
 FYI Only or Action Required?: FYI only for provider: appointment scheduled on 06/24/24.  Patient was last seen in primary care on 11/22/2023 by Norleen Lynwood ORN, MD.  Called Nurse Triage reporting Eye Pain. Blurred vision  Symptoms began several weeks ago.  Interventions attempted: Rest, hydration, or home remedies.  Symptoms are: gradually worsening.  Triage Disposition: See HCP Within 4 Hours (Or PCP Triage)  Patient/caregiver understands and will follow disposition?: Yes                 Reason for Disposition  MODERATE eye pain or discomfort (e.g., interferes with normal activities or awakens from sleep; more than mild)  Answer Assessment - Initial Assessment Questions Appt scheduled today with PCP.     1. ONSET: When did the pain start? (e.g., minutes, hours, days)     2 weeks ago  2. TIMING: Does the pain come and go, or has it been constant since it started? (e.g., constant, intermittent, fleeting)     Comes and goes  3. SEVERITY: How bad is the pain?  (Scale 1-10; mild, moderate or severe)     Discomfort mild to moderate 4. LOCATION: Where does it hurt?  (e.g., eyelid, eye, cheekbone)     Right eye  5. CAUSE: What do you think is causing the pain?     Sprayed cologne in eye accidentally  6. VISION: Do you have blurred vision or changes in your vision?      Blurred vision and sees lines across and now multiple lines  7. EYE DISCHARGE: Is there any discharge (pus) from the eye(s)?  If Yes, ask: What color is it?      Watery discharge  8. FEVER: Do you have a fever? If Yes, ask: What is it, how was it measured, and when did it start?      na 9. OTHER SYMPTOMS: Do you have any other symptoms? (e.g., headache, nasal discharge, facial rash)     Right eye discomfort, aches behind eye, burning at times,  puffy. Watery drainage, runny nose  10. PREGNANCY: Is there any chance you are pregnant? When was your last menstrual period?        na  Protocols used: Eye Pain and Other Symptoms-A-AH

## 2024-06-24 NOTE — Patient Instructions (Signed)
 Please take all new medication as prescribed - the antibiotic  You will be contacted regarding the referral for: Ophthalmology urgent  Please stop smoking  Please continue all other medications as before, and refills have been done if requested.  Please have the pharmacy call with any other refills you may need.  Please keep your appointments with your specialists as you may have planned  You will be contacted regarding the referral for: Abdomen ultrasound  We can hold on lab testing today

## 2024-06-24 NOTE — Assessment & Plan Note (Signed)
 Pt counsled to quit, pt not ready

## 2024-06-26 ENCOUNTER — Ambulatory Visit: Payer: Self-pay

## 2024-06-26 NOTE — Telephone Encounter (Signed)
 FYI Only or Action Required?: Action required by provider: referral request and request for documentation or forms.  Patient was last seen in primary care on 06/24/2024 by Norleen Lynwood ORN, MD.  Called Nurse Triage reporting Advice Only.   Triage Disposition: Call PCP Now  Patient/caregiver understands and will follow disposition?:    Copied from CRM #[id number]. Topic: Clinical - Red Word Triage >> Jun 26, 2024  1:33 PM Peter Schmidt wrote: [facility] that prompted transfer to Nurse Triage: Blurred vision, 06/24/2024 provider visit.  Reason for Disposition  [1] Caller requests to speak ONLY to PCP AND [2] URGENT question  Answer Assessment - Initial Assessment Questions 1. REASON FOR CALL or QUESTION: What is your reason for calling today? or How can I best     Pt called in to f/u on urgent referral to ophthalmology for blurred vision. Pt states that he was PCP 12/29; per chart referral pending to Dr. Octavia. Provided pt with information for Dr. Octavia, per [facility]. Pt voiced appreciation. Reassure him I would send info to PCP for update on referral process. Pt voiced appreciation. Pt also questioned about TOC d/t PCP leaving practice; provided him with providers in clinic who are accepting new patients. Offered to schedule TOC appt at this time but pt declined.  Protocols used: PCP Call - No Triage-A-AH

## 2024-06-28 NOTE — Telephone Encounter (Unsigned)
 Copied from CRM [id number]. Topic: Referral - Status >> Jun 26, 2024  1:52 PM Alfonso HERO wrote: Reason for CRM: patient called asking for his OPHTHALMOLOGY referral be sent over to Dr. Octavia. Fax number is [phone]

## 2024-06-28 NOTE — Telephone Encounter (Signed)
 Yes, I agree with referral to Dr Octavia .  Thanks!

## 2024-06-28 NOTE — Telephone Encounter (Signed)
 Ok I will forward this request to our patient care coordinator, thanks

## 2024-07-03 NOTE — Telephone Encounter (Unsigned)
 Copied from CRM [id number]. Topic: Referral - Question >> Jul 03, 2024  3:30 PM Robinson H wrote: Reason for CRM: Patient is calling to have a new referral to an Eye doctor, states the one that he was referred to [facility] not accepting new patients until August and wants to be seen sooner urgently.  Kyri [phone]

## 2024-07-04 NOTE — Telephone Encounter (Signed)
 Ok I will forward this pt request to [facility]   - thanks

## 2024-07-09 ENCOUNTER — Ambulatory Visit: Payer: Self-pay | Admitting: Internal Medicine

## 2024-07-09 ENCOUNTER — Telehealth: Payer: Self-pay

## 2024-07-09 ENCOUNTER — Ambulatory Visit
Admission: RE | Admit: 2024-07-09 | Discharge: 2024-07-09 | Disposition: A | Source: Ambulatory Visit | Attending: Internal Medicine | Admitting: Internal Medicine

## 2024-07-09 DIAGNOSIS — R1011 Right upper quadrant pain: Secondary | ICD-10-CM

## 2024-07-09 NOTE — Telephone Encounter (Signed)
 Copied from CRM [id number]. Topic: Clinical - Lab/Test Results >> Jul 09, 2024  3:44 PM Berneda FALCON wrote: Reason for CRM: Patient had ultrasound imaging done and is concerned with the results. He would like to speak to someone about these results please.  Patient callback is [phone] (home)

## 2024-07-10 NOTE — Telephone Encounter (Signed)
 Pt states understanding no further questions at this time.
